# Patient Record
Sex: Male | Born: 1970 | Race: White | Hispanic: No | Marital: Married | Smoking: Former smoker
Health system: Southern US, Community
[De-identification: ages and names within clinical notes are randomized; demographics above are authoritative.]

## PROBLEM LIST (undated history)

## (undated) DIAGNOSIS — M109 Gout, unspecified: Secondary | ICD-10-CM

## (undated) HISTORY — PX: TONSILLECTOMY: SUR1361

---

## 2014-02-19 ENCOUNTER — Ambulatory Visit: Payer: Self-pay | Admitting: Physician Assistant

## 2015-06-28 IMAGING — CR DG FOOT COMPLETE 3+V*L*
3 series · 3 of 3 positions shown · non-contrast
Comparison: None.

CLINICAL DATA: Midfoot pain following injury 2 weeks previous

EXAM:
LEFT FOOT - COMPLETE 3+ VIEW

[foot ap]
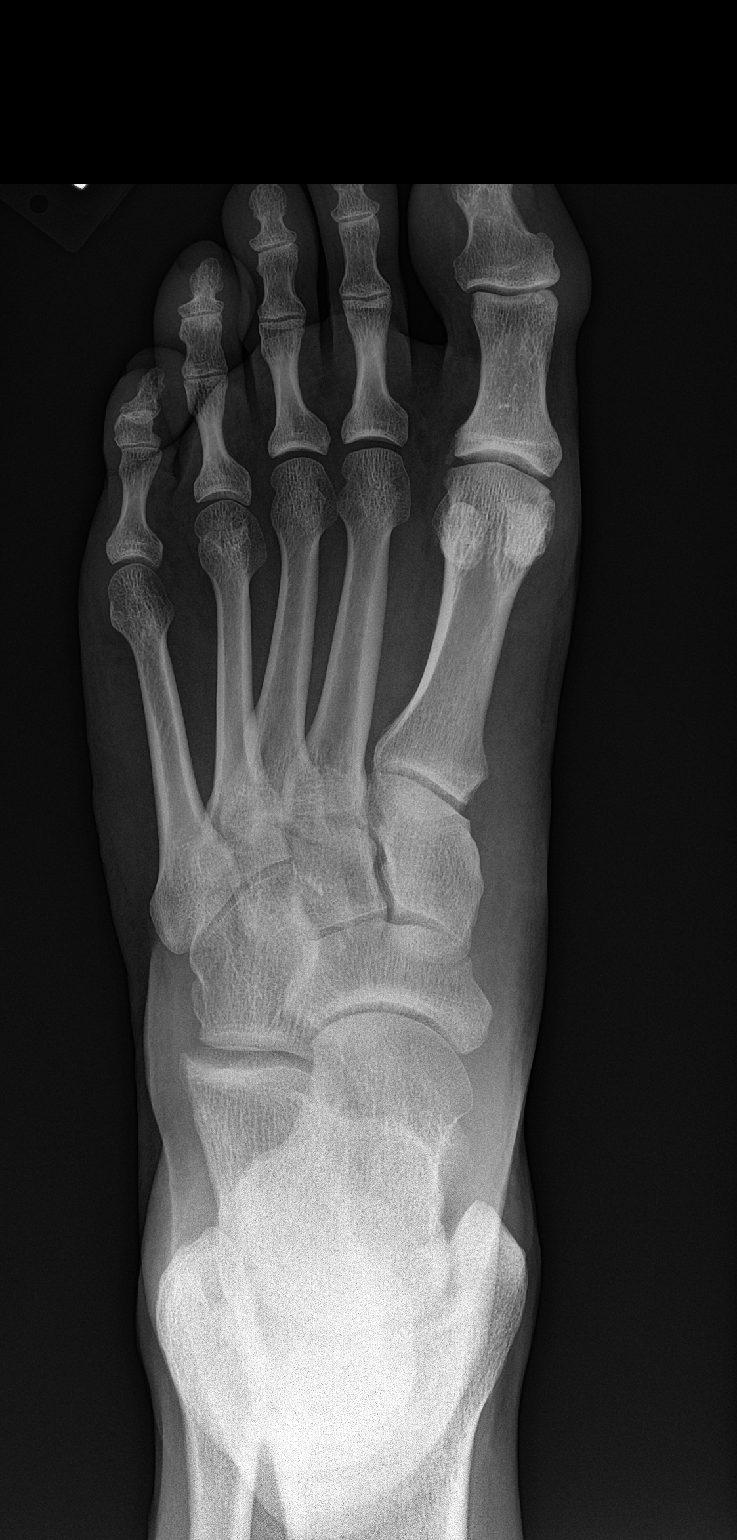

[foot obl]
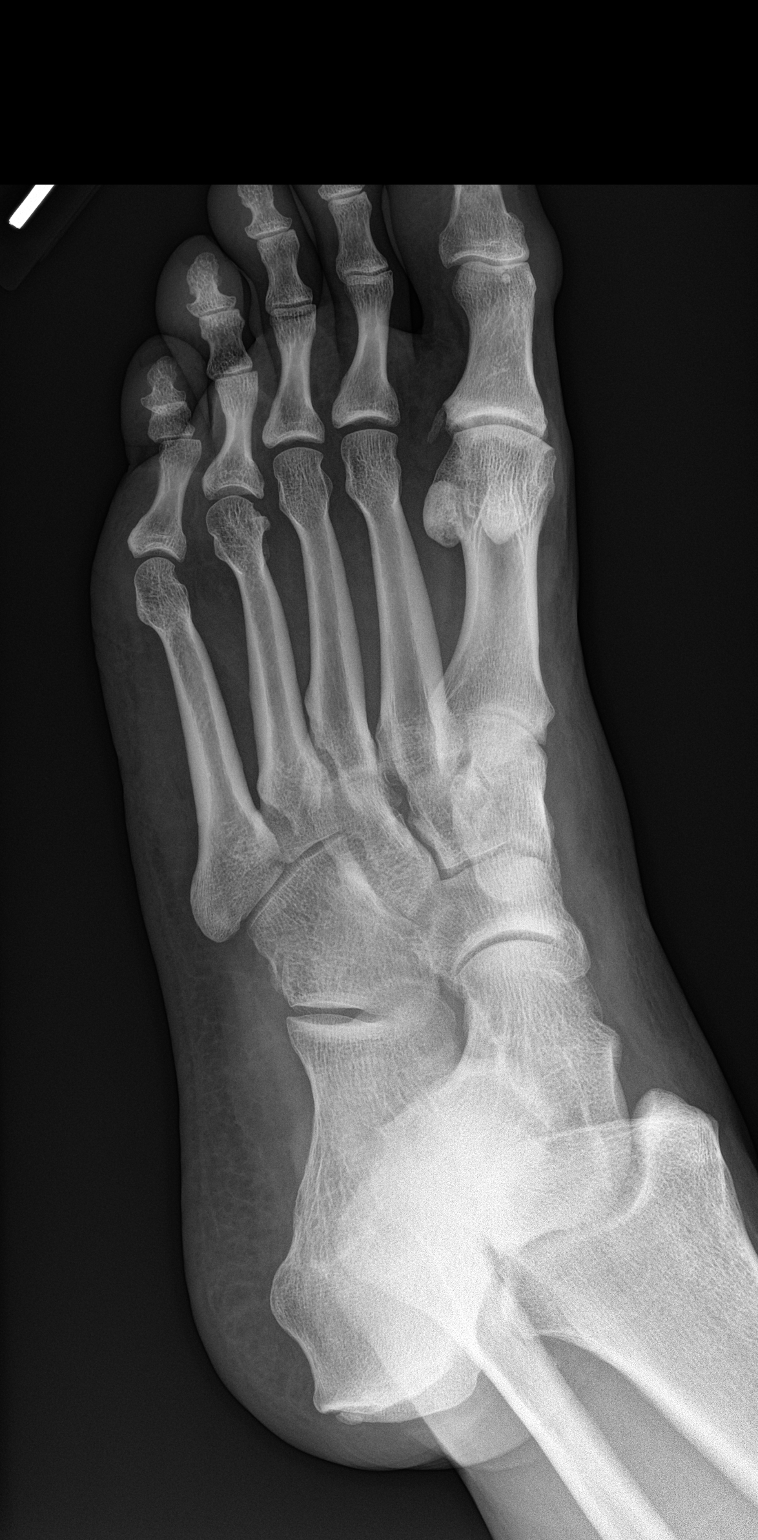

[foot lat]
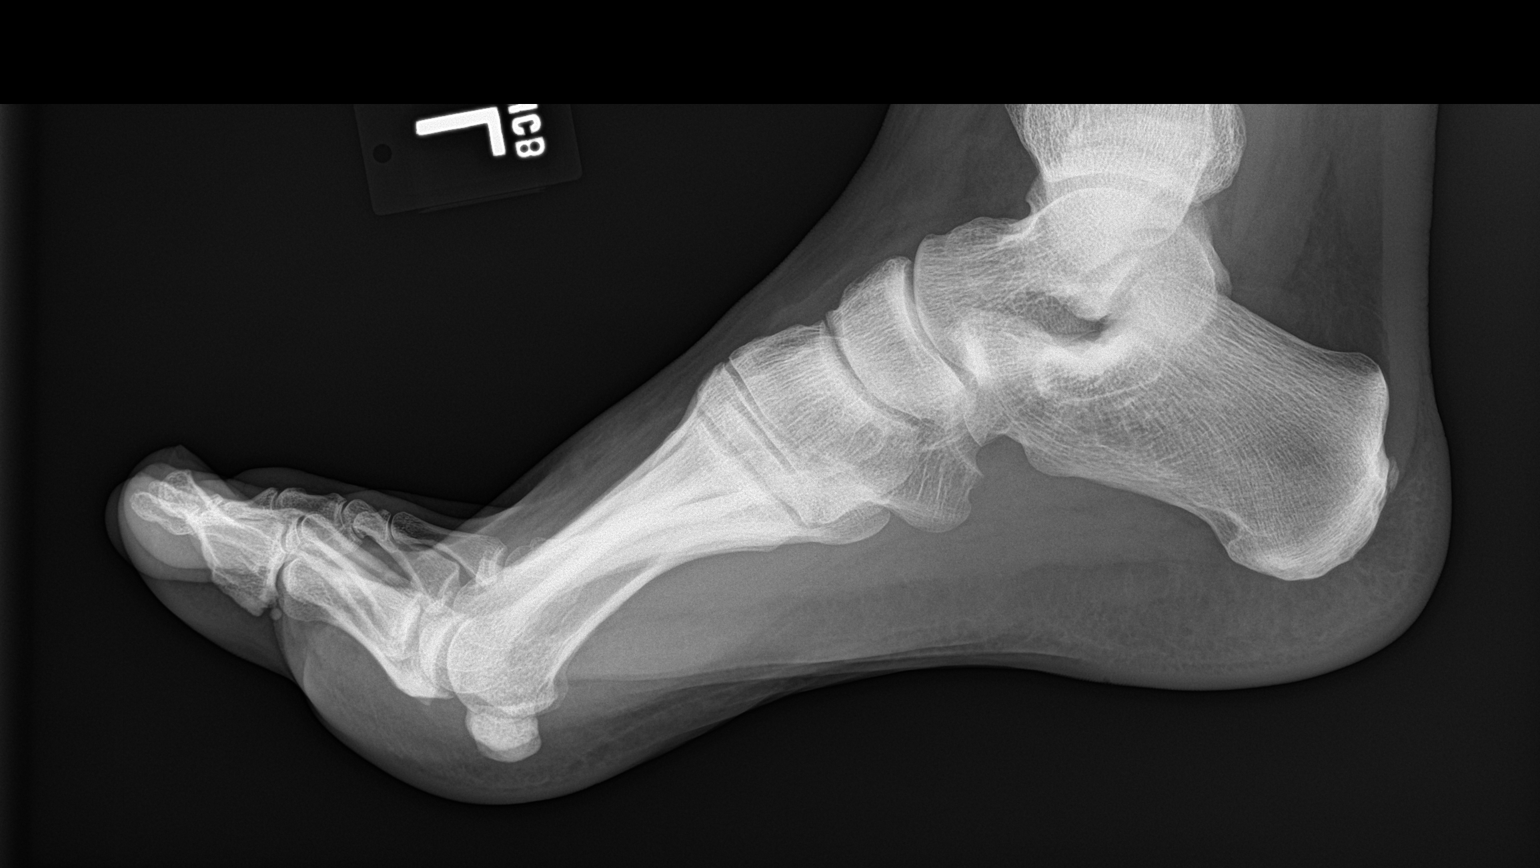

[3 of 3 positions shown; findings below may reference images not displayed]

FINDINGS: There is no evidence of fracture or dislocation. There is no
evidence of arthropathy or other focal bone abnormality. Soft
tissues are unremarkable.
IMPRESSION: No acute abnormality noted.

## 2016-04-16 ENCOUNTER — Telehealth: Payer: Self-pay | Admitting: Orthopedic Surgery

## 2016-04-16 NOTE — Telephone Encounter (Signed)
Patient called to inquire about immediate appointment for knee pain and swelling, states no injury, but seemed to come on all at once. Relayed our provider in clinic today is on call for Emergency room as well as covering for both our doctors, and schedule is over-booked; therefore, relayed primary care doctor, Emergency room, or urgent care.  Patient also is unaware of insurance, which may require primary care referral - patient will call back when and if needs to schedule.

## 2018-04-29 DIAGNOSIS — S29012A Strain of muscle and tendon of back wall of thorax, initial encounter: Secondary | ICD-10-CM | POA: Diagnosis not present

## 2018-05-05 DIAGNOSIS — S29012A Strain of muscle and tendon of back wall of thorax, initial encounter: Secondary | ICD-10-CM | POA: Diagnosis not present

## 2018-12-22 ENCOUNTER — Other Ambulatory Visit: Payer: Self-pay

## 2018-12-22 DIAGNOSIS — Z20822 Contact with and (suspected) exposure to covid-19: Secondary | ICD-10-CM

## 2018-12-22 DIAGNOSIS — R6889 Other general symptoms and signs: Secondary | ICD-10-CM | POA: Diagnosis not present

## 2018-12-25 LAB — NOVEL CORONAVIRUS, NAA: SARS-CoV-2, NAA: NOT DETECTED

## 2019-04-24 ENCOUNTER — Other Ambulatory Visit: Payer: Self-pay

## 2019-04-24 DIAGNOSIS — Z20822 Contact with and (suspected) exposure to covid-19: Secondary | ICD-10-CM

## 2019-04-26 LAB — NOVEL CORONAVIRUS, NAA: SARS-CoV-2, NAA: DETECTED — AB

## 2019-05-05 ENCOUNTER — Other Ambulatory Visit: Payer: Self-pay | Admitting: *Deleted

## 2019-05-05 DIAGNOSIS — Z20822 Contact with and (suspected) exposure to covid-19: Secondary | ICD-10-CM

## 2019-05-07 LAB — NOVEL CORONAVIRUS, NAA: SARS-CoV-2, NAA: DETECTED — AB

## 2019-06-15 DIAGNOSIS — Z23 Encounter for immunization: Secondary | ICD-10-CM | POA: Diagnosis not present

## 2019-07-14 DIAGNOSIS — Z23 Encounter for immunization: Secondary | ICD-10-CM | POA: Diagnosis not present

## 2019-08-28 DIAGNOSIS — S233XXA Sprain of ligaments of thoracic spine, initial encounter: Secondary | ICD-10-CM | POA: Diagnosis not present

## 2019-08-28 DIAGNOSIS — S335XXA Sprain of ligaments of lumbar spine, initial encounter: Secondary | ICD-10-CM | POA: Diagnosis not present

## 2019-08-28 DIAGNOSIS — S134XXA Sprain of ligaments of cervical spine, initial encounter: Secondary | ICD-10-CM | POA: Diagnosis not present

## 2019-10-16 DIAGNOSIS — M25421 Effusion, right elbow: Secondary | ICD-10-CM | POA: Diagnosis not present

## 2019-10-16 DIAGNOSIS — M25521 Pain in right elbow: Secondary | ICD-10-CM | POA: Diagnosis not present

## 2019-10-29 DIAGNOSIS — Z681 Body mass index (BMI) 19 or less, adult: Secondary | ICD-10-CM | POA: Diagnosis not present

## 2019-10-29 DIAGNOSIS — Z1389 Encounter for screening for other disorder: Secondary | ICD-10-CM | POA: Diagnosis not present

## 2019-10-29 DIAGNOSIS — Z0001 Encounter for general adult medical examination with abnormal findings: Secondary | ICD-10-CM | POA: Diagnosis not present

## 2019-10-29 DIAGNOSIS — M1A9XX0 Chronic gout, unspecified, without tophus (tophi): Secondary | ICD-10-CM | POA: Diagnosis not present

## 2020-01-25 DIAGNOSIS — E7849 Other hyperlipidemia: Secondary | ICD-10-CM | POA: Diagnosis not present

## 2020-01-25 DIAGNOSIS — Z0000 Encounter for general adult medical examination without abnormal findings: Secondary | ICD-10-CM | POA: Diagnosis not present

## 2020-02-18 DIAGNOSIS — M1A00X0 Idiopathic chronic gout, unspecified site, without tophus (tophi): Secondary | ICD-10-CM | POA: Diagnosis not present

## 2020-02-18 DIAGNOSIS — Z681 Body mass index (BMI) 19 or less, adult: Secondary | ICD-10-CM | POA: Diagnosis not present

## 2020-06-08 DIAGNOSIS — J0100 Acute maxillary sinusitis, unspecified: Secondary | ICD-10-CM | POA: Diagnosis not present

## 2020-06-08 DIAGNOSIS — Z20828 Contact with and (suspected) exposure to other viral communicable diseases: Secondary | ICD-10-CM | POA: Diagnosis not present

## 2020-06-10 ENCOUNTER — Other Ambulatory Visit: Payer: Self-pay

## 2020-06-10 DIAGNOSIS — Z20822 Contact with and (suspected) exposure to covid-19: Secondary | ICD-10-CM

## 2020-06-14 LAB — NOVEL CORONAVIRUS, NAA: SARS-CoV-2, NAA: NOT DETECTED

## 2021-08-07 DIAGNOSIS — S233XXA Sprain of ligaments of thoracic spine, initial encounter: Secondary | ICD-10-CM | POA: Diagnosis not present

## 2021-08-07 DIAGNOSIS — S338XXA Sprain of other parts of lumbar spine and pelvis, initial encounter: Secondary | ICD-10-CM | POA: Diagnosis not present

## 2021-08-07 DIAGNOSIS — S134XXA Sprain of ligaments of cervical spine, initial encounter: Secondary | ICD-10-CM | POA: Diagnosis not present

## 2022-01-24 ENCOUNTER — Other Ambulatory Visit: Payer: Self-pay

## 2022-01-24 ENCOUNTER — Encounter: Payer: Self-pay | Admitting: Emergency Medicine

## 2022-01-24 ENCOUNTER — Ambulatory Visit
Admission: EM | Admit: 2022-01-24 | Discharge: 2022-01-24 | Disposition: A | Discharge status: Home / Self Care | Payer: BC Managed Care – PPO | Attending: Nurse Practitioner | Admitting: Nurse Practitioner

## 2022-01-24 DIAGNOSIS — M5416 Radiculopathy, lumbar region: Secondary | ICD-10-CM

## 2022-01-24 DIAGNOSIS — G8929 Other chronic pain: Secondary | ICD-10-CM

## 2022-01-24 DIAGNOSIS — Z113 Encounter for screening for infections with a predominantly sexual mode of transmission: Secondary | ICD-10-CM | POA: Insufficient documentation

## 2022-01-24 DIAGNOSIS — R1032 Left lower quadrant pain: Secondary | ICD-10-CM | POA: Diagnosis not present

## 2022-01-24 DIAGNOSIS — N50812 Left testicular pain: Secondary | ICD-10-CM | POA: Diagnosis not present

## 2022-01-24 HISTORY — DX: Gout, unspecified: M10.9

## 2022-01-24 LAB — POCT URINALYSIS DIP (MANUAL ENTRY)
Bilirubin, UA: NEGATIVE
Glucose, UA: NEGATIVE mg/dL
Ketones, POC UA: NEGATIVE mg/dL
Leukocytes, UA: NEGATIVE
Nitrite, UA: NEGATIVE
Protein Ur, POC: NEGATIVE mg/dL
Spec Grav, UA: 1.025 (ref 1.010–1.025)
Urobilinogen, UA: 0.2 E.U./dL
pH, UA: 5 (ref 5.0–8.0)

## 2022-01-24 MED ORDER — KETOROLAC TROMETHAMINE 30 MG/ML IJ SOLN
30.0000 mg | Freq: Once | INTRAMUSCULAR | Status: AC
  Administered 2022-01-24: 30 mg via INTRAMUSCULAR

## 2022-01-24 MED ORDER — DEXAMETHASONE SODIUM PHOSPHATE 10 MG/ML IJ SOLN
10.0000 mg | INTRAMUSCULAR | Status: AC
  Administered 2022-01-24: 10 mg via INTRAMUSCULAR

## 2022-01-24 MED ORDER — IBUPROFEN 800 MG PO TABS: 800.0000 mg | ORAL_TABLET | Freq: Three times a day (TID) | ORAL | 0 refills | Status: DC | PRN

## 2022-01-24 MED ORDER — TAMSULOSIN HCL 0.4 MG PO CAPS: 0.4000 mg | ORAL_CAPSULE | Freq: Every day | ORAL | 0 refills | Status: DC

## 2022-01-24 NOTE — Discharge Instructions (Addendum)
Your urinalysis is negative for urinary tract infection.  A urine culture is pending.  Cytology results are also pending.  You will be contacted if the results are positive to discuss treatment. Increase fluids.  Try to drink at least 10-12 8 ounce glasses of water daily while symptoms persist. Take medication as prescribed.  Take the medication to help urine flow.  If you do have a kidney stone, Strain your urine each time with urination. If your symptoms continue to persist, recommend following up with aurologist for the continued scrotal/testicular pain. Go to the emergency department immediately if you develop worsening scrotal/testicular pain, nausea, vomiting, worsening back pain, or other concerns.

## 2022-01-24 NOTE — ED Triage Notes (Signed)
Pt reports on Monday lower left back pain that is now in left groin and left testicle. Pt denies any known injury and states back pain is resolved but reports constant left groin/leg pain.

## 2022-01-24 NOTE — ED Provider Notes (Signed)
[facility]    CSN: [id number] Arrival date & time: 01/24/22  1711      History   Chief Complaint Chief Complaint  Patient presents with   Testicle Pain    HPI Danny Foster is a 51 y.o. male.   The history is provided by the patient.    Patient presents for complaints of left-sided low back pain and left spicular pain.  Patient states that he has a history of chronic low back pain, but it comes and goes.  He states this episode of his pain started 2 days ago in the left lower back.  Pain is located in the buttock region.  He also states that the pain now radiates into the left groin and left testicle.  Patient states today he has since developed pain that radiates down the front of the thigh.  He denies fever, chills, abdominal pain, nausea, vomiting, urinary frequency, urgency, hesitancy, hematuria, dysuria, penile discharge, lower extremity numbness/tingling, or change in bowel or bladder function.  Patient states that he took Advil for his symptoms which helped, states today the pain has been constant.  He states pain does not change with movement, but changes with sitting.  Patient is married and is in a monogamous relationship with his wife.  Past Medical History:  Diagnosis Date   Gout     There are no problems to display for this patient.   History reviewed. No pertinent surgical history.     Home Medications    Prior to Admission medications   Medication Sig Start Date End Date Taking? Authorizing Provider  ibuprofen (ADVIL) 800 MG tablet Take 1 tablet (800 mg total) by mouth every 8 (eight) hours as needed. 01/24/22  Yes Alazia Crocket-Warren, Sadie Haber, NP  tamsulosin (FLOMAX) 0.4 MG CAPS capsule Take 1 capsule (0.4 mg total) by mouth daily after supper. 01/24/22  Yes Arkin Imran-Warren, Sadie Haber, NP    Family History History reviewed. No pertinent family history.  Social History Social History   Tobacco Use   Smoking status: Never   Smokeless tobacco:  Never     Allergies   Patient has no allergy information on record.   Review of Systems Review of Systems Per HPI  Physical Exam Triage Vital Signs ED Triage Vitals [01/24/22 1817]  Enc Vitals Group     BP 124/84     Pulse Rate 80     Resp 20     Temp 98.2 F (36.8 C)     Temp Source Oral     SpO2 96 %     Weight      Height      Head Circumference      Peak Flow      Pain Score 2     Pain Loc      Pain Edu?      Excl. in GC?    No data found.  Updated Vital Signs BP 124/84 (BP Location: Right Arm)   Pulse 80   Temp 98.2 F (36.8 C) (Oral)   Resp 20   SpO2 96%   Visual Acuity Right Eye Distance:   Left Eye Distance:   Bilateral Distance:    Right Eye Near:   Left Eye Near:    Bilateral Near:     Physical Exam Vitals and nursing note reviewed. Exam conducted with a chaperone present Ardine Eng).  Constitutional:      General: He is not in acute distress.    Appearance: Normal appearance.  He is well-developed.  HENT:     Head: Normocephalic and atraumatic.     Mouth/Throat:     Mouth: Mucous membranes are moist.  Eyes:     Conjunctiva/sclera: Conjunctivae normal.  Cardiovascular:     Rate and Rhythm: Normal rate and regular rhythm.     Pulses: Normal pulses.     Heart sounds: Normal heart sounds. No murmur heard. Pulmonary:     Effort: Pulmonary effort is normal. No respiratory distress.     Breath sounds: Normal breath sounds.  Abdominal:     General: Abdomen is protuberant. Bowel sounds are normal. There is no distension.     Palpations: Abdomen is soft.     Tenderness: There is no abdominal tenderness. There is no right CVA tenderness, left CVA tenderness, guarding or rebound.     Hernia: No hernia is present. There is no hernia in the left inguinal area or right inguinal area.  Genitourinary:    Penis: Uncircumcised. No tenderness or swelling.      Testes: Normal.        Right: Tenderness or swelling not present.        Left:  Tenderness or swelling not present.     Epididymis:     Right: Normal.     Left: Normal.  Musculoskeletal:        General: No swelling.     Cervical back: Normal range of motion.  Lymphadenopathy:     Cervical: No cervical adenopathy.  Skin:    General: Skin is warm and dry.     Capillary Refill: Capillary refill takes less than 2 seconds.  Neurological:     General: No focal deficit present.     Mental Status: He is alert and oriented to person, place, and time.  Psychiatric:        Mood and Affect: Mood normal.        Behavior: Behavior normal.      UC Treatments / Results  Labs (all labs ordered are listed, but only abnormal results are displayed) Labs Reviewed  POCT URINALYSIS DIP (MANUAL ENTRY) - Abnormal; Notable for the following components:      Result Value   Blood, UA trace-lysed (*)    All other components within normal limits  URINE CULTURE  CYTOLOGY, (ORAL, ANAL, URETHRAL) ANCILLARY ONLY    EKG   Radiology No results found.  Procedures Procedures (including critical care time)  Medications Ordered in UC Medications  ketorolac (TORADOL) 30 MG/ML injection 30 mg (30 mg Intramuscular Given 01/24/22 1857)  dexamethasone (DECADRON) injection 10 mg (10 mg Intramuscular Given 01/24/22 1857)    Initial Impression / Assessment and Plan / UC Course  I have reviewed the triage vital signs and the nursing notes.  Pertinent labs & imaging results that were available during my care of the patient were reviewed by me and considered in my medical decision making (see chart for details).  Patient presents for complaints of left testicle, left lower back, and left groin pain that been present for the past 2 to 3 days.  Pain has been intermittent but has worsened today.  Patient's vital signs are stable, he is in no acute distress.  Exam does not reveal any CVA tenderness, scrotal l/testicular tenderness, or hernia.  Patient has decided low back pain that radiates to the  front of the left thigh.  Differential diagnoses include kidney stone, left-sided sciatica, or epididymitis.  Urine culture was positive for blood, urine culture and cytology are  pending.  Patient was provided injections drawn 10 mg IM and Toradol 30 mg IM for his symptoms.  Supportive care recommendations were provided to the patient.  Patient was given a urine strainer, tamsulosin, and ibuprofen for his symptoms.  Patient was advised to follow-up with urology if his symptoms fail to improve.  Patient was given strict indications of when to go to the emergency department.  Patient verbalizes understanding.  All questions were answered. Final Clinical Impressions(s) / UC Diagnoses   Final diagnoses:  Testicular pain, left  Chronic radicular low back pain  Left groin pain     Discharge Instructions      Your urinalysis is negative for urinary tract infection.  A urine culture is pending.  Cytology results are also pending.  You will be contacted if the results are positive to discuss treatment. Increase fluids.  Try to drink at least 10-12 8 ounce glasses of water daily while symptoms persist. Take medication as prescribed.  Take the medication to help urine flow.  If you do have a kidney stone, Strain your urine each time with urination. If your symptoms continue to persist, recommend following up with aurologist for the continued scrotal/testicular pain. Go to the emergency department immediately if you develop worsening scrotal/testicular pain, nausea, vomiting, worsening back pain, or other concerns.      ED Prescriptions     Medication Sig Dispense Auth. Provider   tamsulosin (FLOMAX) 0.4 MG CAPS capsule Take 1 capsule (0.4 mg total) by mouth daily after supper. 30 capsule Daijon Wenke-Warren, Sadie Haber, NP   ibuprofen (ADVIL) 800 MG tablet Take 1 tablet (800 mg total) by mouth every 8 (eight) hours as needed. 30 tablet Samina Weekes-Warren, Sadie Haber, NP      PDMP not reviewed this  encounter.   Abran Cantor, NP 01/24/22 1924

## 2022-01-25 LAB — CYTOLOGY, (ORAL, ANAL, URETHRAL) ANCILLARY ONLY
Chlamydia: NEGATIVE
Comment: NEGATIVE
Comment: NEGATIVE
Comment: NORMAL
Neisseria Gonorrhea: NEGATIVE
Trichomonas: NEGATIVE

## 2022-01-25 LAB — URINE CULTURE: Culture: NO GROWTH

## 2022-01-26 ENCOUNTER — Emergency Department (HOSPITAL_COMMUNITY)
Admission: EM | Admit: 2022-01-26 | Discharge: 2022-01-26 | Disposition: A | Discharge status: Home / Self Care | Payer: BC Managed Care – PPO | Attending: Emergency Medicine | Admitting: Emergency Medicine

## 2022-01-26 ENCOUNTER — Emergency Department (HOSPITAL_COMMUNITY): Payer: BC Managed Care – PPO

## 2022-01-26 ENCOUNTER — Other Ambulatory Visit: Payer: Self-pay

## 2022-01-26 DIAGNOSIS — I861 Scrotal varices: Secondary | ICD-10-CM | POA: Diagnosis not present

## 2022-01-26 DIAGNOSIS — S335XXA Sprain of ligaments of lumbar spine, initial encounter: Secondary | ICD-10-CM | POA: Diagnosis not present

## 2022-01-26 DIAGNOSIS — N433 Hydrocele, unspecified: Secondary | ICD-10-CM | POA: Diagnosis not present

## 2022-01-26 DIAGNOSIS — M545 Low back pain, unspecified: Secondary | ICD-10-CM | POA: Diagnosis not present

## 2022-01-26 DIAGNOSIS — M5442 Lumbago with sciatica, left side: Secondary | ICD-10-CM | POA: Diagnosis not present

## 2022-01-26 DIAGNOSIS — N503 Cyst of epididymis: Secondary | ICD-10-CM | POA: Diagnosis not present

## 2022-01-26 DIAGNOSIS — N200 Calculus of kidney: Secondary | ICD-10-CM | POA: Diagnosis not present

## 2022-01-26 LAB — COMPREHENSIVE METABOLIC PANEL
ALT: 24 U/L (ref 0–44)
AST: 18 U/L (ref 15–41)
Albumin: 4.1 g/dL (ref 3.5–5.0)
Alkaline Phosphatase: 47 U/L (ref 38–126)
Anion gap: 8 (ref 5–15)
BUN: 27 mg/dL — ABNORMAL HIGH (ref 6–20)
CO2: 26 mmol/L (ref 22–32)
Calcium: 8.6 mg/dL — ABNORMAL LOW (ref 8.9–10.3)
Chloride: 107 mmol/L (ref 98–111)
Creatinine, Ser: 1.28 mg/dL — ABNORMAL HIGH (ref 0.61–1.24)
GFR, Estimated: >60 mL/min (ref >60)
Glucose, Bld: 92 mg/dL (ref 70–99)
Potassium: 3.8 mmol/L (ref 3.5–5.1)
Sodium: 141 mmol/L (ref 135–145)
Total Bilirubin: 0.5 mg/dL (ref 0.3–1.2)
Total Protein: 6.9 g/dL (ref 6.5–8.1)

## 2022-01-26 LAB — URINALYSIS, ROUTINE W REFLEX MICROSCOPIC
Bacteria, UA: NONE SEEN
Bilirubin Urine: NEGATIVE
Glucose, UA: NEGATIVE mg/dL
Ketones, ur: NEGATIVE mg/dL
Leukocytes,Ua: NEGATIVE
Nitrite: NEGATIVE
Protein, ur: NEGATIVE mg/dL
Specific Gravity, Urine: 1.027 (ref 1.005–1.030)
pH: 5 (ref 5.0–8.0)

## 2022-01-26 LAB — CBC WITH DIFFERENTIAL/PLATELET
Abs Immature Granulocytes: 0.06 10*3/uL (ref 0.00–0.07)
Basophils Absolute: 0.1 10*3/uL (ref 0.0–0.1)
Basophils Relative: 1 %
Eosinophils Absolute: 0.1 10*3/uL (ref 0.0–0.5)
Eosinophils Relative: 1 %
HCT: 46.3 % (ref 39.0–52.0)
Hemoglobin: 15.3 g/dL (ref 13.0–17.0)
Immature Granulocytes: 0 %
Lymphocytes Relative: 31 %
Lymphs Abs: 4.1 10*3/uL — ABNORMAL HIGH (ref 0.7–4.0)
MCH: 27.6 pg (ref 26.0–34.0)
MCHC: 33 g/dL (ref 30.0–36.0)
MCV: 83.6 fL (ref 80.0–100.0)
Monocytes Absolute: 1 10*3/uL (ref 0.1–1.0)
Monocytes Relative: 7 %
Neutro Abs: 8.1 10*3/uL — ABNORMAL HIGH (ref 1.7–7.7)
Neutrophils Relative %: 60 %
Platelets: 240 10*3/uL (ref 150–400)
RBC: 5.54 MIL/uL (ref 4.22–5.81)
RDW: 14.2 % (ref 11.5–15.5)
WBC: 13.4 10*3/uL — ABNORMAL HIGH (ref 4.0–10.5)
nRBC: 0 % (ref 0.0–0.2)

## 2022-01-26 LAB — LIPASE, BLOOD: Lipase: 34 U/L (ref 11–51)

## 2022-01-26 MED ORDER — IBUPROFEN 600 MG PO TABS: 600.0000 mg | ORAL_TABLET | Freq: Four times a day (QID) | ORAL | 0 refills | Status: DC | PRN

## 2022-01-26 NOTE — ED Provider Notes (Signed)
[facility] EMERGENCY DEPARTMENT Provider Note   CSN: [id number] Arrival date & time: 01/26/22  1341     History Chief Complaint  Patient presents with   Back Pain    Danny Foster is a 51 y.o. male otherwise healthy presents the emergency room and for evaluation of lower left-sided back pain for the past week.  He reports that 5 days ago on Monday he started to have some left-sided testicular pain when he was falling asleep.  He reports he woke up the next morning was feeling fine however that night was having some pain.  He reports that on Wednesday the pain was still present so he went to urgent care.  Urgent care said it was likely a kidney stone and placed him on medications.  He is coming back today because he is still having continued pain.  He reports the pain is worse in his testicles whenever he is sitting on the toilet and is testicles are hanging down.  He denies any dysuria, hematuria, abdominal pain, nausea, vomiting, constipation, diarrhea, fever, urinary fecal incontinence, urinary retention, weakness.  Denies any penile discharge or concern for STDs.  No previous surgeries on his back.  No known drug allergies.  Denies any tobacco, EtOH, illicit drug use ever.  Denies any IV drug use ever.   Back Pain Associated symptoms: no chest pain, no dysuria, no fever, no numbness and no weakness        Home Medications Prior to Admission medications   Medication Sig Start Date End Date Taking? Authorizing Provider  ibuprofen (ADVIL) 800 MG tablet Take 1 tablet (800 mg total) by mouth every 8 (eight) hours as needed. 01/24/22   Leath-Warren, Sadie Haber, NP  tamsulosin (FLOMAX) 0.4 MG CAPS capsule Take 1 capsule (0.4 mg total) by mouth daily after supper. 01/24/22   Leath-Warren, Sadie Haber, NP      Allergies    Patient has no known allergies.    Review of Systems   Review of Systems  Constitutional:  Negative for chills and fever.  Respiratory:  Negative for shortness of  breath.   Cardiovascular:  Negative for chest pain.  Gastrointestinal:  Negative for abdominal distention, constipation, diarrhea, nausea and vomiting.       Denies any fecal incontinence  Genitourinary:  Positive for testicular pain. Negative for decreased urine volume, dysuria, hematuria, penile discharge, penile pain, penile swelling and scrotal swelling.       Denies any urinary incontinence or urinary retention.  Musculoskeletal:  Positive for back pain.       Denies any saddle anesthesia  Neurological:  Negative for weakness and numbness.    Physical Exam Updated Vital Signs BP 122/78 (BP Location: Right Arm)   Pulse 66   Temp 97.6 F (36.4 C) (Oral)   Resp 16   Ht 6\' 3"  (1.905 m)   Wt 131.5 kg   SpO2 100%   BMI 36.25 kg/m  Physical Exam Vitals and nursing note reviewed. Exam conducted with a chaperone present ( , [place]).  Constitutional:      General: He is not in acute distress.    Appearance: Normal appearance. He is not ill-appearing or toxic-appearing.  HENT:     Head: Normocephalic and atraumatic.  Eyes:     General: No scleral icterus. Cardiovascular:     Rate and Rhythm: Normal rate and regular rhythm.  Pulmonary:     Effort: Pulmonary effort is normal. No respiratory distress.     Breath sounds: Normal  breath sounds.  Abdominal:     General: Abdomen is flat. Bowel sounds are normal.     Palpations: Abdomen is soft.     Tenderness: There is no abdominal tenderness. There is no right CVA tenderness, left CVA tenderness, guarding or rebound.  Genitourinary:    Pubic Area: No rash.      Penis: Normal. No tenderness, discharge or swelling.      Testes:        Left: Varicocele present. Tenderness not present.     Comments: Normal lie of bilateral testicles.  Suspect varicocele palpated to the posterior upper aspect of the left testicle.  No discoloration noted.  No discharge from the penis noted.  No increased warmth or erythema.  No wounds  noted. Musculoskeletal:        General: No swelling, tenderness or deformity.     Cervical back: Normal range of motion.     Comments: No midline or paraspinal, thoracic, or lumbar tenderness palpation.  No bony step-offs or deformities.  No increased erythema or warmth noted to the spine.  No tenderness to the flank.  Cannot reproduce pain via palpation.  Sensation intact throughout.  Patient has 5 out of 5 strength in patient's upper and lower bilateral extremities.  Compartments are soft.  Palpable pulses.  Patient is ambulatory with ease.  Skin:    General: Skin is warm and dry.  Neurological:     General: No focal deficit present.     Mental Status: He is alert. Mental status is at baseline.     Sensory: No sensory deficit.     Motor: No weakness.     Gait: Gait normal.     ED Results / Procedures / Treatments   Labs (all labs ordered are listed, but only abnormal results are displayed) Labs Reviewed  URINALYSIS, ROUTINE W REFLEX MICROSCOPIC - Abnormal; Notable for the following components:      Result Value   Hgb urine dipstick SMALL (*)    All other components within normal limits  CBC WITH DIFFERENTIAL/PLATELET - Abnormal; Notable for the following components:   WBC 13.4 (*)    Neutro Abs 8.1 (*)    Lymphs Abs 4.1 (*)    All other components within normal limits  COMPREHENSIVE METABOLIC PANEL  LIPASE, BLOOD  GC/CHLAMYDIA PROBE AMP ([place]) NOT AT [facility]    EKG None  Radiology US SCROTUM W/DOPPLER  Result Date: 01/26/2022 CLINICAL DATA:  Left testicular pain EXAM: SCROTAL ULTRASOUND DOPPLER ULTRASOUND OF THE TESTICLES TECHNIQUE: Complete ultrasound examination of the testicles, epididymis, and other scrotal structures was performed. Color and spectral Doppler ultrasound were also utilized to evaluate blood flow to the testicles. COMPARISON:  None Available. FINDINGS: Right testicle Measurements: 5.0 x 2.3 x 3.3 cm. No mass or microlithiasis visualized. Left testicle  Measurements: 4.9 x 2.0 x 2.9 cm. No mass or microlithiasis visualized. Right epididymis: Normal in size and appearance. 3 mm benign epididymal cyst noted. Left epididymis:  Normal in size and appearance. Hydrocele: None visualized. Physiologic fluid within the scrotal sac bilaterally. Varicocele:  Moderate left varicocele noted. Pulsed Doppler interrogation of both testes demonstrates normal low resistance arterial and venous waveforms bilaterally. IMPRESSION: 1. No evidence of testicular torsion. 2. Moderate left varicocele. Electronically Signed   By: Helyn Numbers M.D.   On: 01/26/2022 19:35   CT Renal Stone Study  Result Date: 01/26/2022 CLINICAL DATA:  Left back pain x1 week, diagnosed with kidney stone EXAM: CT ABDOMEN AND PELVIS WITHOUT  CONTRAST TECHNIQUE: Multidetector CT imaging of the abdomen and pelvis was performed following the standard protocol without IV contrast. RADIATION DOSE REDUCTION: This exam was performed according to the departmental dose-optimization program which includes automated exposure control, adjustment of the mA and/or kV according to patient size and/or use of iterative reconstruction technique. COMPARISON:  None Available. FINDINGS: Lower chest: Lung bases are clear. Hepatobiliary: Unenhanced liver is unremarkable. Gallbladder is unremarkable. No intrahepatic or extrahepatic duct dilatation. Pancreas: Within normal limits. Spleen: Within normal limits. Adrenals/Urinary Tract: Adrenal glands are within normal limits. Kidneys are within normal limits. No renal, ureteral, or bladder calculi. No hydronephrosis. Bladder is within normal limits. Stomach/Bowel: Stomach is within normal limits. No evidence of bowel obstruction. Normal appendix (series 2/image 49). No colonic wall thickening or inflammatory changes. Vascular/Lymphatic: No evidence of abdominal aortic aneurysm. Atherosclerotic calcifications of the abdominal aorta and branch vessels. No suspicious abdominopelvic  lymphadenopathy. Reproductive: Prostate is unremarkable. Other: No abdominopelvic ascites. Musculoskeletal: Mild degenerative changes of the lower thoracic spine. IMPRESSION: No renal, ureteral, or bladder calculi. No hydronephrosis. Negative CT abdomen/pelvis. Electronically Signed   By: Charline Bills M.D.   On: 01/26/2022 18:01     Procedures Procedures   Medications Ordered in ED Medications - No data to display  ED Course/ Medical Decision Making/ A&P                           Medical Decision Making Amount and/or Complexity of Data Reviewed Labs: ordered. Radiology: ordered.  Risk Prescription drug management.   51 year old male presents the emergency room for evaluation of left lower back pain and left testicle pain.  Differential diagnosis includes was limited to nephrolithiasis, pyelonephritis, sciatica, testicular torsion, varicocele, hydrocele, epididymitis, UTI.  Vital signs are unremarkable.  Physical exam as noted above.  We will order CT renal to rule out any kidney stone as well as basic labs.  I independently reviewed and interpreted the patient's labs.  CMP shows creatinine at 1.28 with a BUN of 27, no previous labs compare this to.  Mild decrease in calcium at 8.6.  Otherwise, no electrolyte or LFT abnormalities.  Lipase at 34.  CBC mildly elevated at 13.4 with a left shift although patient did receive Decadron a few days ago.  Urinalysis shows small amount of hemoglobin otherwise unremarkable.  Lipase normal.  GC pending.  CT renal showed no renal, ureteral, or bladder calculi. No hydronephrosis. Negative CT abdomen/pelvis.   We will proceed with ultrasound.  US shows 1. No evidence of testicular torsion. 2. Moderate left varicocele.  Patient is likely experiencing left testicular pain from his varicocele.  Pain is relieved whenever he is laying down, I doubt any vascular congestion however will have patient follow-up with urologist.  I doubt any epidural  abscess given the patient's reassuring vital signs and denies any history of any IV drug use.  Urinalysis not consistent with any UTI.  Does not suspect any cauda equina given the patient's lack of incontinence or urinary retention or saddle anesthesia.  We will have the patient follow-up with his primary care doctor.  We will have him start on ibuprofen 60 mg every 6 hours as needed for pain.  Additional information included on varicoceles into the discharge paperwork.  Included information for a urologist in the discharge paperwork.  I discussed the patient's labs and imaging results with him.  We discussed the need to follow-up with urology.  I do not think the patient's  back pain is related to his testicular pain, although could be referred pain.  I do not think he has any emergent or abnormal back red flag symptoms.  Can follow-up with his primary care doctor.  We discussed strict return precautions and red flag symptoms.  Patient verbalized understanding and agrees to the plan.  Patient is stable being discharged home in good condition.  I discussed this case with my attending physician who cosigned this note including patient's presenting symptoms, physical exam, and planned diagnostics and interventions. Attending physician stated agreement with plan or made changes to plan which were implemented.   Final Clinical Impression(s) / ED Diagnoses Final diagnoses:  Varicocele  Acute left-sided low back pain without sciatica    Rx / DC Orders ED Discharge Orders          Ordered    ibuprofen (ADVIL) 600 MG tablet  Every 6 hours PRN        01/26/22 2009              Achille Rich, [place] 01/30/22 1415    Vanetta Mulders, MD 02/02/22 [phone]

## 2022-01-26 NOTE — ED Triage Notes (Signed)
Pt to ED via POV c/o back pain and left leg pain, Pt seen for the same at urgent care on Wednesday and dx with kidney stone. Currently taking flomax. Here today because back still hurts.

## 2022-01-26 NOTE — Discharge Instructions (Addendum)
You were seen in the ER for evaluation of your lower back and testicle pain. Unsure of what is causing your back pain, your physical exam is normal for your back. The CT did show some mild degenerative changes, which could be the source. I would like for you to follow up with your PCP about this. Your [place] did show a varicocele on your left testicle which may be the source of your pain. I have included more information about them in this paperwork. I would like for you to follow up with urology for this. I have left the information for a urologist into the discharge paperwork for you to follow up with.  If you have any urinary or fecal incontinence, urinary retention, numbness in your bottom area, fevers, weakness, worsening pain, please return to the nearest emergency department for evaluation.  If you have any concerns, new or worsening symptoms, please return to the ER for re-evaluation.    Contact a health care provider if: Your pain is increasing. You have redness in the affected area. Your testicle becomes enlarged, swollen, or painful. You have swelling that does not decrease when you are lying down. One of your testicles is smaller than the other. Get help right away if: You develop swelling in your legs. You have difficulty breathing.

## 2022-01-29 DIAGNOSIS — M5442 Lumbago with sciatica, left side: Secondary | ICD-10-CM | POA: Diagnosis not present

## 2022-01-29 DIAGNOSIS — S335XXA Sprain of ligaments of lumbar spine, initial encounter: Secondary | ICD-10-CM | POA: Diagnosis not present

## 2022-01-29 LAB — GC/CHLAMYDIA PROBE AMP (~~LOC~~) NOT AT ARMC
Chlamydia: NEGATIVE
Comment: NEGATIVE
Comment: NORMAL
Neisseria Gonorrhea: NEGATIVE

## 2022-01-30 DIAGNOSIS — M5442 Lumbago with sciatica, left side: Secondary | ICD-10-CM | POA: Diagnosis not present

## 2022-01-30 DIAGNOSIS — E6609 Other obesity due to excess calories: Secondary | ICD-10-CM | POA: Diagnosis not present

## 2022-01-30 DIAGNOSIS — S335XXA Sprain of ligaments of lumbar spine, initial encounter: Secondary | ICD-10-CM | POA: Diagnosis not present

## 2022-01-30 DIAGNOSIS — M5416 Radiculopathy, lumbar region: Secondary | ICD-10-CM | POA: Diagnosis not present

## 2022-01-30 DIAGNOSIS — Z6836 Body mass index (BMI) 36.0-36.9, adult: Secondary | ICD-10-CM | POA: Diagnosis not present

## 2022-01-31 DIAGNOSIS — S335XXA Sprain of ligaments of lumbar spine, initial encounter: Secondary | ICD-10-CM | POA: Diagnosis not present

## 2022-01-31 DIAGNOSIS — M5442 Lumbago with sciatica, left side: Secondary | ICD-10-CM | POA: Diagnosis not present

## 2022-02-01 DIAGNOSIS — M5442 Lumbago with sciatica, left side: Secondary | ICD-10-CM | POA: Diagnosis not present

## 2022-02-01 DIAGNOSIS — S335XXA Sprain of ligaments of lumbar spine, initial encounter: Secondary | ICD-10-CM | POA: Diagnosis not present

## 2022-02-06 ENCOUNTER — Other Ambulatory Visit (HOSPITAL_COMMUNITY): Payer: Self-pay | Admitting: Internal Medicine

## 2022-02-06 DIAGNOSIS — M5442 Lumbago with sciatica, left side: Secondary | ICD-10-CM | POA: Diagnosis not present

## 2022-02-06 DIAGNOSIS — S335XXA Sprain of ligaments of lumbar spine, initial encounter: Secondary | ICD-10-CM | POA: Diagnosis not present

## 2022-02-06 DIAGNOSIS — M5416 Radiculopathy, lumbar region: Secondary | ICD-10-CM

## 2022-02-07 DIAGNOSIS — M5442 Lumbago with sciatica, left side: Secondary | ICD-10-CM | POA: Diagnosis not present

## 2022-02-07 DIAGNOSIS — S335XXA Sprain of ligaments of lumbar spine, initial encounter: Secondary | ICD-10-CM | POA: Diagnosis not present

## 2022-02-08 ENCOUNTER — Other Ambulatory Visit (HOSPITAL_COMMUNITY): Payer: Self-pay | Admitting: Internal Medicine

## 2022-02-08 DIAGNOSIS — M5442 Lumbago with sciatica, left side: Secondary | ICD-10-CM | POA: Diagnosis not present

## 2022-02-08 DIAGNOSIS — Z0189 Encounter for other specified special examinations: Secondary | ICD-10-CM

## 2022-02-08 DIAGNOSIS — S335XXA Sprain of ligaments of lumbar spine, initial encounter: Secondary | ICD-10-CM | POA: Diagnosis not present

## 2022-02-09 DIAGNOSIS — M5442 Lumbago with sciatica, left side: Secondary | ICD-10-CM | POA: Diagnosis not present

## 2022-02-09 DIAGNOSIS — S335XXA Sprain of ligaments of lumbar spine, initial encounter: Secondary | ICD-10-CM | POA: Diagnosis not present

## 2022-02-12 ENCOUNTER — Ambulatory Visit
Admission: RE | Admit: 2022-02-12 | Discharge: 2022-02-12 | Disposition: A | Discharge status: Home / Self Care | Payer: BC Managed Care – PPO | Source: Ambulatory Visit | Attending: Internal Medicine | Admitting: Internal Medicine

## 2022-02-12 DIAGNOSIS — M5126 Other intervertebral disc displacement, lumbar region: Secondary | ICD-10-CM | POA: Diagnosis not present

## 2022-02-12 DIAGNOSIS — Z0189 Encounter for other specified special examinations: Secondary | ICD-10-CM

## 2022-02-12 DIAGNOSIS — M5416 Radiculopathy, lumbar region: Secondary | ICD-10-CM | POA: Diagnosis not present

## 2022-02-12 DIAGNOSIS — Z135 Encounter for screening for eye and ear disorders: Secondary | ICD-10-CM | POA: Diagnosis not present

## 2022-02-13 DIAGNOSIS — S335XXA Sprain of ligaments of lumbar spine, initial encounter: Secondary | ICD-10-CM | POA: Diagnosis not present

## 2022-02-13 DIAGNOSIS — M5442 Lumbago with sciatica, left side: Secondary | ICD-10-CM | POA: Diagnosis not present

## 2022-02-15 DIAGNOSIS — S335XXA Sprain of ligaments of lumbar spine, initial encounter: Secondary | ICD-10-CM | POA: Diagnosis not present

## 2022-02-15 DIAGNOSIS — M5442 Lumbago with sciatica, left side: Secondary | ICD-10-CM | POA: Diagnosis not present

## 2022-02-16 DIAGNOSIS — M5442 Lumbago with sciatica, left side: Secondary | ICD-10-CM | POA: Diagnosis not present

## 2022-02-16 DIAGNOSIS — S335XXA Sprain of ligaments of lumbar spine, initial encounter: Secondary | ICD-10-CM | POA: Diagnosis not present

## 2022-02-19 DIAGNOSIS — S335XXA Sprain of ligaments of lumbar spine, initial encounter: Secondary | ICD-10-CM | POA: Diagnosis not present

## 2022-02-19 DIAGNOSIS — M5442 Lumbago with sciatica, left side: Secondary | ICD-10-CM | POA: Diagnosis not present

## 2022-02-22 DIAGNOSIS — S335XXA Sprain of ligaments of lumbar spine, initial encounter: Secondary | ICD-10-CM | POA: Diagnosis not present

## 2022-02-22 DIAGNOSIS — M5442 Lumbago with sciatica, left side: Secondary | ICD-10-CM | POA: Diagnosis not present

## 2022-02-27 DIAGNOSIS — S335XXA Sprain of ligaments of lumbar spine, initial encounter: Secondary | ICD-10-CM | POA: Diagnosis not present

## 2022-02-27 DIAGNOSIS — M5442 Lumbago with sciatica, left side: Secondary | ICD-10-CM | POA: Diagnosis not present

## 2022-03-06 DIAGNOSIS — S335XXA Sprain of ligaments of lumbar spine, initial encounter: Secondary | ICD-10-CM | POA: Diagnosis not present

## 2022-03-06 DIAGNOSIS — M5126 Other intervertebral disc displacement, lumbar region: Secondary | ICD-10-CM | POA: Diagnosis not present

## 2022-03-06 DIAGNOSIS — M5442 Lumbago with sciatica, left side: Secondary | ICD-10-CM | POA: Diagnosis not present

## 2022-03-06 DIAGNOSIS — M5440 Lumbago with sciatica, unspecified side: Secondary | ICD-10-CM | POA: Diagnosis not present

## 2022-03-09 DIAGNOSIS — Z125 Encounter for screening for malignant neoplasm of prostate: Secondary | ICD-10-CM | POA: Diagnosis not present

## 2022-03-09 DIAGNOSIS — M5416 Radiculopathy, lumbar region: Secondary | ICD-10-CM | POA: Diagnosis not present

## 2022-03-09 DIAGNOSIS — Z1331 Encounter for screening for depression: Secondary | ICD-10-CM | POA: Diagnosis not present

## 2022-03-09 DIAGNOSIS — Z0001 Encounter for general adult medical examination with abnormal findings: Secondary | ICD-10-CM | POA: Diagnosis not present

## 2022-03-09 DIAGNOSIS — Z6836 Body mass index (BMI) 36.0-36.9, adult: Secondary | ICD-10-CM | POA: Diagnosis not present

## 2022-03-13 DIAGNOSIS — S335XXA Sprain of ligaments of lumbar spine, initial encounter: Secondary | ICD-10-CM | POA: Diagnosis not present

## 2022-03-13 DIAGNOSIS — M5442 Lumbago with sciatica, left side: Secondary | ICD-10-CM | POA: Diagnosis not present

## 2022-03-20 ENCOUNTER — Encounter: Payer: Self-pay | Admitting: Urology

## 2022-03-20 ENCOUNTER — Ambulatory Visit: Payer: BC Managed Care – PPO | Admitting: Urology

## 2022-03-20 VITALS — BP 138/82 | HR 112 | Ht 75.0 in | Wt 275.0 lb

## 2022-03-20 DIAGNOSIS — R319 Hematuria, unspecified: Secondary | ICD-10-CM | POA: Diagnosis not present

## 2022-03-20 DIAGNOSIS — I861 Scrotal varices: Secondary | ICD-10-CM | POA: Diagnosis not present

## 2022-03-20 LAB — URINALYSIS, ROUTINE W REFLEX MICROSCOPIC
Bilirubin, UA: NEGATIVE
Glucose, UA: NEGATIVE
Nitrite, UA: NEGATIVE
Protein,UA: NEGATIVE
Specific Gravity, UA: 1.02 (ref 1.005–1.030)
Urobilinogen, Ur: 0.2 mg/dL (ref 0.2–1.0)
pH, UA: 5.5 (ref 5.0–7.5)

## 2022-03-20 LAB — MICROSCOPIC EXAMINATION: Bacteria, UA: NONE SEEN

## 2022-03-20 NOTE — Progress Notes (Signed)
Assessment: 1. Hematuria - dipstick only   2. Varicocele, left     Plan: I reviewed the patient records from his recent urgent care and ER visits as well as notes from Dr. Phillips Odor. I reviewed imaging and lab results as well. Diagnosis of hematuria discussed with the patient.  Advised him that this is based on microscopic evaluation rather than dipstick urinalysis.  He no evidence of microscopic hematuria on urinalysis from 8/25 and today. Recommend repeat urinalysis in 1 month.  If this is negative, no further evaluation would be indicated. Diagnosis of a varicocele discussed.  He is currently asymptomatic.  No treatment recommended.  Chief Complaint:  Chief Complaint  Patient presents with   Hematuria    History of Present Illness:  Danny Foster is a 51 y.o. male who is seen in consultation from Elfredia Nevins, MD for evaluation of microscopic hematuria. He was evaluated for left low back pain with radiation into the left groin and scrotal area at urgent care on 01/24/2022 and in the emergency room on 01/26/2022.  A scrotal ultrasound on 01/26/2022 showed normal testes bilaterally, 3 mm right epididymal cyst, and moderate left varicocele. CT abdomen and pelvis without contrast performed on 01/26/2022 for evaluation of left sided back pain showed no renal or ureteral calculi and no evidence of obstruction. He was ultimately diagnosed with a pinched nerve in his back.  His back pain has resolved.  No further scrotal or testicular pain.  No scrotal swelling or redness. He is not having any lower urinary tract symptoms.  No dysuria or gross hematuria.  No history of UTIs or kidney stones. Urinalysis from 01/26/2022 showed 0-5 RBCs. Dipstick urinalysis from 03/09/2022 showed trace blood.  He has a prior history of tobacco use smoking 1-1/2 packs/day for 18 years.  He quit in 2010.  IPSS = 0 today.  Past Medical History:  Past Medical History:  Diagnosis Date   Gout     Past  Surgical History:  Past Surgical History:  Procedure Laterality Date   TONSILLECTOMY      Allergies:  No Known Allergies  Family History:  History reviewed. No pertinent family history.  Social History:  Social History   Tobacco Use   Smoking status: Former    Packs/day: 1.50    Years: 18.00    Total pack years: 27.00    Types: Cigarettes    Quit date: 2010    Years since quitting: 13.8   Smokeless tobacco: Never  Substance Use Topics   Alcohol use: Not Currently   Drug use: Never    Review of symptoms:  Constitutional:  Negative for unexplained weight loss, night sweats, fever, chills ENT:  Negative for nose bleeds, sinus pain, painful swallowing CV:  Negative for chest pain, shortness of breath, exercise intolerance, palpitations, loss of consciousness Resp:  Negative for cough, wheezing, shortness of breath GI:  Negative for nausea, vomiting, diarrhea, bloody stools GU:  Positives noted in HPI; otherwise negative for gross hematuria, dysuria, urinary incontinence Neuro:  Negative for seizures, poor balance, limb weakness, slurred speech Psych:  Negative for lack of energy, depression, anxiety Endocrine:  Negative for polydipsia, polyuria, symptoms of hypoglycemia (dizziness, hunger, sweating) Hematologic:  Negative for anemia, purpura, petechia, prolonged or excessive bleeding, use of anticoagulants  Allergic:  Negative for difficulty breathing or choking as a result of exposure to anything; no shellfish allergy; no allergic response (rash/itch) to materials, foods  Physical exam: BP 138/82   Pulse (!) 112  Ht 6\' 3"  (1.905 m)   Wt 275 lb (124.7 kg)   BMI 34.37 kg/m  GENERAL APPEARANCE:  Well appearing, well developed, well nourished, NAD HEENT: Atraumatic, Normocephalic, oropharynx clear. NECK: Supple without lymphadenopathy or thyromegaly. LUNGS: Clear to auscultation bilaterally. HEART: Regular Rate and Rhythm without murmurs, gallops, or rubs. ABDOMEN:  Soft, non-tender, No Masses. EXTREMITIES: Moves all extremities well.  Without clubbing, cyanosis, or edema. NEUROLOGIC:  Alert and oriented x 3, normal gait, CN II-XII grossly intact.  MENTAL STATUS:  Appropriate. BACK:  Non-tender to palpation.  No CVAT SKIN:  Warm, dry and intact.   GU: Penis:  uncircumcised Meatus: Normal Scrotum: No erythema or edema; fullness of left spermatic cord; possible varicocele Testis: normal without masses bilateral Epididymis: normal   Results: U/A: 0-5 WBC, 0-2 RBC

## 2022-03-26 DIAGNOSIS — S335XXA Sprain of ligaments of lumbar spine, initial encounter: Secondary | ICD-10-CM | POA: Diagnosis not present

## 2022-03-26 DIAGNOSIS — M5442 Lumbago with sciatica, left side: Secondary | ICD-10-CM | POA: Diagnosis not present

## 2022-04-09 DIAGNOSIS — S335XXA Sprain of ligaments of lumbar spine, initial encounter: Secondary | ICD-10-CM | POA: Diagnosis not present

## 2022-04-09 DIAGNOSIS — M5442 Lumbago with sciatica, left side: Secondary | ICD-10-CM | POA: Diagnosis not present

## 2022-04-17 ENCOUNTER — Ambulatory Visit: Payer: BC Managed Care – PPO | Admitting: Urology

## 2022-04-17 DIAGNOSIS — M5440 Lumbago with sciatica, unspecified side: Secondary | ICD-10-CM | POA: Diagnosis not present

## 2022-04-19 ENCOUNTER — Encounter: Payer: Self-pay | Admitting: Urology

## 2022-04-19 ENCOUNTER — Ambulatory Visit: Payer: BC Managed Care – PPO | Admitting: Urology

## 2022-04-19 VITALS — BP 118/78 | HR 83 | Ht 75.0 in | Wt 278.0 lb

## 2022-04-19 DIAGNOSIS — R319 Hematuria, unspecified: Secondary | ICD-10-CM | POA: Diagnosis not present

## 2022-04-19 NOTE — Progress Notes (Signed)
   Assessment: 1. Hematuria - dipstick only    Plan: Diagnosis of hematuria discussed with the patient.  Advised him that this is based on microscopic evaluation rather than dipstick urinalysis.   No evidence of microscopic hematuria on urinalysis from 8/25, 10/17, and today. Return to office prn  Chief Complaint:  Chief Complaint  Patient presents with   Hematuria    History of Present Illness:  Danny Foster is a 51 y.o. male who is seen for further evaluation of hematuria. He was evaluated for left low back pain with radiation into the left groin and scrotal area at urgent care on 01/24/2022 and in the emergency room on 01/26/2022.  A scrotal ultrasound on 01/26/2022 showed normal testes bilaterally, 3 mm right epididymal cyst, and moderate left varicocele. CT abdomen and pelvis without contrast performed on 01/26/2022 for evaluation of left sided back pain showed no renal or ureteral calculi and no evidence of obstruction. He was ultimately diagnosed with a pinched nerve in his back.  His back pain resolved.  No further scrotal or testicular pain.  No scrotal swelling or redness. No lower urinary tract symptoms.  No dysuria or gross hematuria.  No history of UTIs or kidney stones. Urinalysis from 01/26/2022 showed 0-5 RBCs. Dipstick urinalysis from 03/09/2022 showed trace blood.  He has a prior history of tobacco use smoking 1-1/2 packs/day for 18 years.  He quit in 2010.  IPSS = 0 at visit in 10/23. U/A:  0-2 RBCs  He returns today for scheduled follow-up.  No change in urinary symptoms.  No dysuria or gross hematuria.  No scrotal pain.  Portions of the above documentation were copied from a prior visit for review purposes only.   Past Medical History:  Past Medical History:  Diagnosis Date   Gout     Past Surgical History:  Past Surgical History:  Procedure Laterality Date   TONSILLECTOMY      Allergies:  No Known Allergies  Family History:  No family history on  file.  Social History:  Social History   Tobacco Use   Smoking status: Former    Packs/day: 1.50    Years: 18.00    Total pack years: 27.00    Types: Cigarettes    Quit date: 2010    Years since quitting: 13.8   Smokeless tobacco: Never  Substance Use Topics   Alcohol use: Not Currently   Drug use: Never    ROS: Constitutional:  Negative for fever, chills, weight loss CV: Negative for chest pain, previous MI, hypertension Respiratory:  Negative for shortness of breath, wheezing, sleep apnea, frequent cough GI:  Negative for nausea, vomiting, bloody stool, GERD  Physical exam: BP 118/78   Pulse 83   Ht 6\' 3"  (1.905 m)   Wt 278 lb (126.1 kg)   BMI 34.75 kg/m  GENERAL APPEARANCE:  Well appearing, well developed, well nourished, NAD HEENT:  Atraumatic, normocephalic, oropharynx clear NECK:  Supple without lymphadenopathy or thyromegaly ABDOMEN:  Soft, non-tender, no masses EXTREMITIES:  Moves all extremities well, without clubbing, cyanosis, or edema NEUROLOGIC:  Alert and oriented x 3, normal gait, CN II-XII grossly intact MENTAL STATUS:  appropriate BACK:  Non-tender to palpation, No CVAT SKIN:  Warm, dry, and intact  Results: U/A: dipstick negative

## 2022-04-20 LAB — URINALYSIS, ROUTINE W REFLEX MICROSCOPIC
Bilirubin, UA: NEGATIVE
Glucose, UA: NEGATIVE
Leukocytes,UA: NEGATIVE
Nitrite, UA: NEGATIVE
Protein,UA: NEGATIVE
RBC, UA: NEGATIVE
Specific Gravity, UA: 1.025 (ref 1.005–1.030)
Urobilinogen, Ur: 0.2 mg/dL (ref 0.2–1.0)
pH, UA: 5.5 (ref 5.0–7.5)

## 2022-04-23 DIAGNOSIS — S335XXA Sprain of ligaments of lumbar spine, initial encounter: Secondary | ICD-10-CM | POA: Diagnosis not present

## 2022-04-23 DIAGNOSIS — M5442 Lumbago with sciatica, left side: Secondary | ICD-10-CM | POA: Diagnosis not present

## 2022-05-08 DIAGNOSIS — S335XXA Sprain of ligaments of lumbar spine, initial encounter: Secondary | ICD-10-CM | POA: Diagnosis not present

## 2022-05-08 DIAGNOSIS — M5442 Lumbago with sciatica, left side: Secondary | ICD-10-CM | POA: Diagnosis not present

## 2022-05-22 DIAGNOSIS — M5442 Lumbago with sciatica, left side: Secondary | ICD-10-CM | POA: Diagnosis not present

## 2022-05-22 DIAGNOSIS — S335XXA Sprain of ligaments of lumbar spine, initial encounter: Secondary | ICD-10-CM | POA: Diagnosis not present

## 2022-06-12 DIAGNOSIS — S338XXA Sprain of other parts of lumbar spine and pelvis, initial encounter: Secondary | ICD-10-CM | POA: Diagnosis not present

## 2022-06-12 DIAGNOSIS — S233XXA Sprain of ligaments of thoracic spine, initial encounter: Secondary | ICD-10-CM | POA: Diagnosis not present

## 2022-07-03 DIAGNOSIS — S338XXA Sprain of other parts of lumbar spine and pelvis, initial encounter: Secondary | ICD-10-CM | POA: Diagnosis not present

## 2022-07-03 DIAGNOSIS — S233XXA Sprain of ligaments of thoracic spine, initial encounter: Secondary | ICD-10-CM | POA: Diagnosis not present

## 2022-07-24 DIAGNOSIS — S233XXA Sprain of ligaments of thoracic spine, initial encounter: Secondary | ICD-10-CM | POA: Diagnosis not present

## 2022-07-24 DIAGNOSIS — S338XXA Sprain of other parts of lumbar spine and pelvis, initial encounter: Secondary | ICD-10-CM | POA: Diagnosis not present

## 2022-08-14 DIAGNOSIS — S233XXA Sprain of ligaments of thoracic spine, initial encounter: Secondary | ICD-10-CM | POA: Diagnosis not present

## 2022-08-14 DIAGNOSIS — S338XXA Sprain of other parts of lumbar spine and pelvis, initial encounter: Secondary | ICD-10-CM | POA: Diagnosis not present

## 2022-09-04 DIAGNOSIS — S338XXA Sprain of other parts of lumbar spine and pelvis, initial encounter: Secondary | ICD-10-CM | POA: Diagnosis not present

## 2022-09-04 DIAGNOSIS — S233XXA Sprain of ligaments of thoracic spine, initial encounter: Secondary | ICD-10-CM | POA: Diagnosis not present

## 2022-09-25 DIAGNOSIS — S338XXA Sprain of other parts of lumbar spine and pelvis, initial encounter: Secondary | ICD-10-CM | POA: Diagnosis not present

## 2022-09-25 DIAGNOSIS — S233XXA Sprain of ligaments of thoracic spine, initial encounter: Secondary | ICD-10-CM | POA: Diagnosis not present

## 2022-10-16 DIAGNOSIS — S233XXA Sprain of ligaments of thoracic spine, initial encounter: Secondary | ICD-10-CM | POA: Diagnosis not present

## 2022-10-16 DIAGNOSIS — S338XXA Sprain of other parts of lumbar spine and pelvis, initial encounter: Secondary | ICD-10-CM | POA: Diagnosis not present

## 2023-04-26 ENCOUNTER — Ambulatory Visit (INDEPENDENT_AMBULATORY_CARE_PROVIDER_SITE_OTHER): Payer: Commercial Managed Care - PPO | Admitting: Internal Medicine

## 2023-04-26 ENCOUNTER — Telehealth: Payer: Self-pay | Admitting: *Deleted

## 2023-04-26 ENCOUNTER — Other Ambulatory Visit: Payer: Self-pay | Admitting: *Deleted

## 2023-04-26 ENCOUNTER — Encounter: Payer: Self-pay | Admitting: Internal Medicine

## 2023-04-26 ENCOUNTER — Encounter: Payer: Self-pay | Admitting: *Deleted

## 2023-04-26 VITALS — BP 103/72 | HR 83 | Temp 98.5°F | Ht 75.0 in | Wt 258.2 lb

## 2023-04-26 DIAGNOSIS — R195 Other fecal abnormalities: Secondary | ICD-10-CM | POA: Diagnosis not present

## 2023-04-26 MED ORDER — PEG 3350-KCL-NA BICARB-NACL 420 G PO SOLR: 4000.0000 mL | Freq: Once | ORAL | 0 refills | Status: AC

## 2023-04-26 NOTE — Telephone Encounter (Signed)
[facility] PA: Case ID# [id number] was submitted on 04-26-2023

## 2023-04-26 NOTE — Patient Instructions (Signed)
We will schedule you for colonoscopy given your recent positive Cologuard test.  It was very nice meeting you today.  Dr. Marletta Lor

## 2023-04-26 NOTE — Progress Notes (Signed)
Primary Care Physician:  Elfredia Nevins, MD Primary Gastroenterologist:  Dr. Marletta Lor  Chief Complaint  Patient presents with   New Patient (Initial Visit)    Pos cologuard    HPI:   Danny Foster is a 52 y.o. male who presents to the clinic today by referral from his PCP Dr. Sherwood Gambler for evaluation.  Recently underwent Cologuard testing for colon cancer screening which was positive.  Denies any melena hematochezia.  No abdominal pain.  No unintentional weight loss.  Denies any family history of colorectal malignancy.  Denies any upper GI symptoms including heartburn, reflux, dysphagia/odynophagia, epigastric or chest pain.  Overall no complaints from a GI perspective.  Past Medical History:  Diagnosis Date   Gout     Past Surgical History:  Procedure Laterality Date   TONSILLECTOMY      Current Outpatient Medications  Medication Sig Dispense Refill   magnesium 30 MG tablet Take 30 mg by mouth 2 (two) times daily.     Multiple Vitamin (MULTIVITAMIN) tablet Take 1 tablet by mouth daily.     ibuprofen (ADVIL) 600 MG tablet Take 1 tablet (600 mg total) by mouth every 6 (six) hours as needed. 30 tablet 0   No current facility-administered medications for this visit.    Allergies as of 04/26/2023   (No Known Allergies)    No family history on file.  Social History   Socioeconomic History   Marital status: Married    Spouse name: Not on file   Number of children: Not on file   Years of education: Not on file   Highest education level: Not on file  Occupational History   Not on file  Tobacco Use   Smoking status: Former    Current packs/day: 0.00    Average packs/day: 1.5 packs/day for 18.0 years (27.0 ttl pk-yrs)    Types: Cigarettes    Start date: 35    Quit date: 2010    Years since quitting: 14.9   Smokeless tobacco: Never  Substance and Sexual Activity   Alcohol use: Not Currently   Drug use: Never   Sexual activity: Not on file  Other Topics  Concern   Not on file  Social History Narrative   Not on file   Social Determinants of Health   Financial Resource Strain: Not on file  Food Insecurity: Not on file  Transportation Needs: Not on file  Physical Activity: Not on file  Stress: Not on file  Social Connections: Not on file  Intimate Partner Violence: Not on file    Subjective: Review of Systems  Constitutional:  Negative for chills and fever.  HENT:  Negative for congestion and hearing loss.   Eyes:  Negative for blurred vision and double vision.  Respiratory:  Negative for cough and shortness of breath.   Cardiovascular:  Negative for chest pain and palpitations.  Gastrointestinal:  Negative for abdominal pain, blood in stool, constipation, diarrhea, heartburn, melena and vomiting.  Genitourinary:  Negative for dysuria and urgency.  Musculoskeletal:  Negative for joint pain and myalgias.  Skin:  Negative for itching and rash.  Neurological:  Negative for dizziness and headaches.  Psychiatric/Behavioral:  Negative for depression. The patient is not nervous/anxious.        Objective: BP 103/72   Pulse 83   Temp 98.5 F (36.9 C)   Ht 6\' 3"  (1.905 m)   Wt 258 lb 3.2 oz (117.1 kg)   BMI 32.27 kg/m  Physical Exam Constitutional:  Appearance: Normal appearance.  HENT:     Head: Normocephalic and atraumatic.  Eyes:     Extraocular Movements: Extraocular movements intact.     Conjunctiva/sclera: Conjunctivae normal.  Cardiovascular:     Rate and Rhythm: Normal rate and regular rhythm.  Pulmonary:     Effort: Pulmonary effort is normal.     Breath sounds: Normal breath sounds.  Abdominal:     General: Bowel sounds are normal.     Palpations: Abdomen is soft.  Musculoskeletal:        General: Normal range of motion.     Cervical back: Normal range of motion and neck supple.  Skin:    General: Skin is warm.  Neurological:     General: No focal deficit present.     Mental Status: He is alert and  oriented to person, place, and time.  Psychiatric:        Mood and Affect: Mood normal.        Behavior: Behavior normal.      Assessment: *Positive Cologuard testing  Plan: Will schedule for screening colonoscopy.The risks including infection, bleed, or perforation as well as benefits, limitations, alternatives and imponderables have been reviewed with the patient. Questions have been answered. All parties agreeable.  Thank you Dr. Sherwood Gambler for the kind referral.  04/26/2023 8:59 AM   Disclaimer: This note was dictated with voice recognition software. Similar sounding words can inadvertently be transcribed and may not be corrected upon review.

## 2023-06-04 ENCOUNTER — Encounter (HOSPITAL_COMMUNITY)
Admission: RE | Disposition: A | Discharge status: Home / Self Care | Payer: Self-pay | Source: Home / Self Care | Attending: Internal Medicine

## 2023-06-04 ENCOUNTER — Ambulatory Visit (HOSPITAL_BASED_OUTPATIENT_CLINIC_OR_DEPARTMENT_OTHER): Payer: Commercial Managed Care - PPO | Admitting: Anesthesiology

## 2023-06-04 ENCOUNTER — Ambulatory Visit (HOSPITAL_COMMUNITY)
Admission: RE | Admit: 2023-06-04 | Discharge: 2023-06-04 | Disposition: A | Discharge status: Home / Self Care | Payer: Commercial Managed Care - PPO | Attending: Internal Medicine | Admitting: Internal Medicine

## 2023-06-04 ENCOUNTER — Other Ambulatory Visit: Payer: Self-pay

## 2023-06-04 ENCOUNTER — Encounter (HOSPITAL_COMMUNITY): Payer: Self-pay

## 2023-06-04 ENCOUNTER — Ambulatory Visit (HOSPITAL_COMMUNITY): Payer: Commercial Managed Care - PPO | Admitting: Anesthesiology

## 2023-06-04 DIAGNOSIS — Z1211 Encounter for screening for malignant neoplasm of colon: Secondary | ICD-10-CM

## 2023-06-04 DIAGNOSIS — K635 Polyp of colon: Secondary | ICD-10-CM

## 2023-06-04 DIAGNOSIS — R195 Other fecal abnormalities: Secondary | ICD-10-CM | POA: Diagnosis present

## 2023-06-04 DIAGNOSIS — D122 Benign neoplasm of ascending colon: Secondary | ICD-10-CM

## 2023-06-04 DIAGNOSIS — I10 Essential (primary) hypertension: Secondary | ICD-10-CM | POA: Diagnosis not present

## 2023-06-04 DIAGNOSIS — D125 Benign neoplasm of sigmoid colon: Secondary | ICD-10-CM | POA: Insufficient documentation

## 2023-06-04 DIAGNOSIS — Z87891 Personal history of nicotine dependence: Secondary | ICD-10-CM | POA: Diagnosis not present

## 2023-06-04 DIAGNOSIS — K648 Other hemorrhoids: Secondary | ICD-10-CM

## 2023-06-04 HISTORY — PX: POLYPECTOMY: SHX149

## 2023-06-04 HISTORY — PX: COLONOSCOPY WITH PROPOFOL: SHX5780

## 2023-06-04 SURGERY — COLONOSCOPY WITH PROPOFOL
Anesthesia: General

## 2023-06-04 MED ORDER — LACTATED RINGERS IV SOLN: INTRAVENOUS | Status: DC

## 2023-06-04 MED ORDER — PROPOFOL 10 MG/ML IV BOLUS
INTRAVENOUS | Status: DC | PRN
  Administered 2023-06-04: 100 mg via INTRAVENOUS
  Administered 2023-06-04: 50 mg via INTRAVENOUS

## 2023-06-04 MED ORDER — LIDOCAINE HCL (PF) 2 % IJ SOLN
INTRAMUSCULAR | Status: DC | PRN
  Administered 2023-06-04: 50 mg via INTRADERMAL

## 2023-06-04 MED ORDER — PROPOFOL 500 MG/50ML IV EMUL
INTRAVENOUS | Status: DC | PRN
  Administered 2023-06-04: 150 ug/kg/min via INTRAVENOUS

## 2023-06-04 NOTE — Discharge Instructions (Addendum)
  Colonoscopy Discharge Instructions  Read the instructions outlined below and refer to this sheet in the next few weeks. These discharge instructions provide you with general information on caring for yourself after you leave the hospital. Your doctor may also give you specific instructions. While your treatment has been planned according to the most current medical practices available, unavoidable complications occasionally occur.   ACTIVITY You may resume your regular activity, but move at a slower pace for the next 24 hours.  Take frequent rest periods for the next 24 hours.  Walking will help get rid of the air and reduce the bloated feeling in your belly (abdomen).  No driving for 24 hours (because of the medicine (anesthesia) used during the test).   Do not sign any important legal documents or operate any machinery for 24 hours (because of the anesthesia used during the test).  NUTRITION Drink plenty of fluids.  You may resume your normal diet as instructed by your doctor.  Begin with a light meal and progress to your normal diet. Heavy or fried foods are harder to digest and may make you feel sick to your stomach (nauseated).  Avoid alcoholic beverages for 24 hours or as instructed.  MEDICATIONS You may resume your normal medications unless your doctor tells you otherwise.  WHAT YOU CAN EXPECT TODAY Some feelings of bloating in the abdomen.  Passage of more gas than usual.  Spotting of blood in your stool or on the toilet paper.  IF YOU HAD POLYPS REMOVED DURING THE COLONOSCOPY: No aspirin products for 7 days or as instructed.  No alcohol for 7 days or as instructed.  Eat a soft diet for the next 24 hours.  FINDING OUT THE RESULTS OF YOUR TEST Not all test results are available during your visit. If your test results are not back during the visit, make an appointment with your caregiver to find out the results. Do not assume everything is normal if you have not heard from your  caregiver or the medical facility. It is important for you to follow up on all of your test results.  SEEK IMMEDIATE MEDICAL ATTENTION IF: You have more than a spotting of blood in your stool.  Your belly is swollen (abdominal distention).  You are nauseated or vomiting.  You have a temperature over 101.  You have abdominal pain or discomfort that is severe or gets worse throughout the day.   Your colonoscopy revealed 2 polyp(s) which I removed successfully. Await pathology results, my office will contact you. I recommend repeating colonoscopy in 7 years for surveillance purposes.    I hope you have a great rest of your week!  Carlin POUR. Cindie, D.O. Gastroenterology and Hepatology [facility]

## 2023-06-04 NOTE — Anesthesia Procedure Notes (Signed)
 Date/Time: 06/04/2023 11:12 AM  Performed by: Eliodoro Deward FALCON, CRNAPre-anesthesia Checklist: Emergency Drugs available, Patient identified, Suction available and Patient being monitored Patient Re-evaluated:Patient Re-evaluated prior to induction Oxygen Delivery Method: Nasal cannula Induction Type: IV induction Placement Confirmation: positive ETCO2

## 2023-06-04 NOTE — Anesthesia Preprocedure Evaluation (Signed)

## 2023-06-04 NOTE — Op Note (Signed)
 [facility] Patient Name: Danny Foster Procedure Date: 06/04/2023 10:44 AM MRN: [id number] Date of Birth: 1970-07-23 Attending MD: Carlin POUR. Cindie , [place], [id number] CSN: [id number] Age: 52 Admit Type: Outpatient Procedure:                Colonoscopy Indications:              Screening for colorectal malignant neoplasm,                            Incidental - Positive Cologuard test Providers:                Carlin POUR. Cindie, DO, Crystal Page, Bascom Blush Referring MD:              Medicines:                See the Anesthesia note for documentation of the                            administered medications Complications:            No immediate complications. Estimated Blood Loss:     Estimated blood loss was minimal. Procedure:                Pre-Anesthesia Assessment:                           - The anesthesia plan was to use monitored                            anesthesia care (MAC).                           After obtaining informed consent, the colonoscope                            was passed under direct vision. Throughout the                            procedure, the patient's blood pressure, pulse, and                            oxygen saturations were monitored continuously. The                            [id number]-HQ190L ([id number]) scope was introduced through                            the anus and advanced to the the cecum, identified                            by appendiceal orifice and ileocecal valve. The                            colonoscopy was performed without difficulty. The                            patient tolerated  the procedure well. The quality                            of the bowel preparation was evaluated using the                            BBPS [facility] Bowel Preparation Scale) with scores                            of: Right Colon = 3, Transverse Colon = 3 and Left                            Colon = 3 (entire mucosa seen well with no residual                             staining, small fragments of stool or opaque                            liquid). The total BBPS score equals 9. Scope In: 11:17:45 AM Scope Out: 11:35:20 AM Scope Withdrawal Time: 0 hours 12 minutes 49 seconds  Total Procedure Duration: 0 hours 17 minutes 35 seconds  Findings:      Non-bleeding internal hemorrhoids were found during endoscopy.      A 7 mm polyp was found in the ascending colon. The polyp was sessile.       The polyp was removed with a cold snare. Resection and retrieval were       complete.      A 5 mm polyp was found in the sigmoid colon. The polyp was sessile. The       polyp was removed with a cold snare. Resection and retrieval were       complete.      The exam was otherwise without abnormality. Impression:               - Non-bleeding internal hemorrhoids.                           - One 7 mm polyp in the ascending colon, removed                            with a cold snare. Resected and retrieved.                           - One 5 mm polyp in the sigmoid colon, removed with                            a cold snare. Resected and retrieved.                           - The examination was otherwise normal. Moderate Sedation:      Per Anesthesia Care Recommendation:           - Patient has a contact number available for  emergencies. The signs and symptoms of potential                            delayed complications were discussed with the                            patient. Return to normal activities tomorrow.                            Written discharge instructions were provided to the                            patient.                           - Resume previous diet.                           - Continue present medications.                           - Await pathology results.                           - Repeat colonoscopy in 7 years for surveillance.                           - Return to GI clinic  PRN. Procedure Code(s):        --- Professional ---                           [id number], Colonoscopy, flexible; with removal of                            tumor(s), polyp(s), or other lesion(s) by snare                            technique Diagnosis Code(s):        --- Professional ---                           Z12.11, Encounter for screening for malignant                            neoplasm of colon                           D12.2, Benign neoplasm of ascending colon                           D12.5, Benign neoplasm of sigmoid colon                           K64.8, Other hemorrhoids CPT copyright 2022 American Medical Association. All rights reserved. The codes documented in this report are preliminary and upon coder review may  be revised to meet current compliance requirements. Carlin POUR. Cindie, DO Carlin  LOIS Hasty, DO 06/04/2023 11:38:59 AM This report has been signed electronically. Number of Addenda: 0

## 2023-06-04 NOTE — Anesthesia Postprocedure Evaluation (Signed)
 Anesthesia Post Note  Patient: Danny Foster  Procedure(s) Performed: COLONOSCOPY WITH PROPOFOL  POLYPECTOMY INTESTINAL  Patient location during evaluation: Phase II Anesthesia Type: General Level of consciousness: awake Pain management: pain level controlled Vital Signs Assessment: post-procedure vital signs reviewed and stable Respiratory status: spontaneous breathing and respiratory function stable Cardiovascular status: blood pressure returned to baseline and stable Postop Assessment: no headache and no apparent nausea or vomiting Anesthetic complications: no Comments: Late entry   No notable events documented.   Last Vitals:  Vitals:   06/04/23 1137 06/04/23 1141  BP: (!) 90/54 102/67  Pulse: 74 87  Resp: 16 17  Temp: (!) 36.1 C   SpO2: 97% 97%    Last Pain:  Vitals:   06/04/23 1141  TempSrc:   PainSc: 0-No pain                 Yvonna JINNY Bosworth

## 2023-06-04 NOTE — Transfer of Care (Signed)
 Immediate Anesthesia Transfer of Care Note  Patient: Danny Foster  Procedure(s) Performed: COLONOSCOPY WITH PROPOFOL  POLYPECTOMY INTESTINAL  Patient Location: Endoscopy Unit  Anesthesia Type:General  Level of Consciousness: drowsy  Airway & Oxygen Therapy: Patient Spontanous Breathing  Post-op Assessment: Report given to RN and Post -op Vital signs reviewed and stable  Post vital signs: Reviewed and stable  Last Vitals:  Vitals Value Taken Time  BP    Temp    Pulse    Resp    SpO2      Last Pain:  Vitals:   06/04/23 1113  TempSrc:   PainSc: 0-No pain      Patients Stated Pain Goal: 7 (06/04/23 1108)  Complications: No notable events documented.

## 2023-06-04 NOTE — H&P (Signed)
  Primary Care Physician:  Bertell Satterfield, MD Primary Gastroenterologist:  Dr. Cindie  Pre-Procedure History & Physical: HPI:  Danny Foster is a 52 y.o. male is here for a colonoscopy for colon cancer screening purposes, positive Cologuard testing  Past Medical History:  Diagnosis Date   Gout     Past Surgical History:  Procedure Laterality Date   TONSILLECTOMY      Prior to Admission medications   Medication Sig Start Date End Date Taking? Authorizing Provider  magnesium 30 MG tablet Take 30 mg by mouth 2 (two) times daily.    [provider]  Multiple Vitamin (MULTIVITAMIN) tablet Take 1 tablet by mouth daily.    [provider]    Allergies as of 04/26/2023   (No Known Allergies)    No family history on file.  Social History   Socioeconomic History   Marital status: Married    Spouse name: Not on file   Number of children: Not on file   Years of education: Not on file   Highest education level: Not on file  Occupational History   Not on file  Tobacco Use   Smoking status: Former    Current packs/day: 0.00    Average packs/day: 1.5 packs/day for 18.0 years (27.0 ttl pk-yrs)    Types: Cigarettes    Start date: 45    Quit date: 2010    Years since quitting: 15.0   Smokeless tobacco: Never  Substance and Sexual Activity   Alcohol use: Not Currently   Drug use: Never   Sexual activity: Not on file  Other Topics Concern   Not on file  Social History Narrative   Not on file   Social Drivers of Health   Financial Resource Strain: Not on file  Food Insecurity: Not on file  Transportation Needs: Not on file  Physical Activity: Not on file  Stress: Not on file  Social Connections: Not on file  Intimate Partner Violence: Not on file    Review of Systems: See HPI, otherwise negative ROS  Physical Exam: Vital signs in last 24 hours:     General:   Alert,  Well-developed, well-nourished, pleasant and cooperative in NAD Head:   Normocephalic and atraumatic. Eyes:  Sclera clear, no icterus.   Conjunctiva pink. Ears:  Normal auditory acuity. Nose:  No deformity, discharge,  or lesions. Msk:  Symmetrical without gross deformities. Normal posture. Extremities:  Without clubbing or edema. Neurologic:  Alert and  oriented x4;  grossly normal neurologically. Skin:  Intact without significant lesions or rashes. Psych:  Alert and cooperative. Normal mood and affect.  Impression/Plan: Danny Foster is here for a colonoscopy to be performed for colon cancer screening purposes, positive Cologuard testing  The risks of the procedure including infection, bleed, or perforation as well as benefits, limitations, alternatives and imponderables have been reviewed with the patient. Questions have been answered. All parties agreeable.

## 2023-06-06 LAB — SURGICAL PATHOLOGY

## 2023-06-14 ENCOUNTER — Encounter (HOSPITAL_COMMUNITY): Payer: Self-pay | Admitting: Internal Medicine

## 2024-01-09 ENCOUNTER — Other Ambulatory Visit (HOSPITAL_COMMUNITY): Payer: Self-pay | Admitting: Otolaryngology

## 2024-01-09 ENCOUNTER — Ambulatory Visit (HOSPITAL_COMMUNITY)
Admission: RE | Admit: 2024-01-09 | Discharge: 2024-01-09 | Disposition: A | Discharge status: Home / Self Care | Source: Ambulatory Visit | Attending: Otolaryngology | Admitting: Otolaryngology

## 2024-01-09 ENCOUNTER — Encounter: Payer: Self-pay | Admitting: Emergency Medicine

## 2024-01-09 ENCOUNTER — Ambulatory Visit: Admission: EM | Admit: 2024-01-09 | Discharge: 2024-01-09 | Disposition: A | Discharge status: Home / Self Care

## 2024-01-09 DIAGNOSIS — M5459 Other low back pain: Secondary | ICD-10-CM

## 2024-01-09 DIAGNOSIS — G8929 Other chronic pain: Secondary | ICD-10-CM

## 2024-01-09 DIAGNOSIS — M5442 Lumbago with sciatica, left side: Secondary | ICD-10-CM | POA: Diagnosis not present

## 2024-01-09 MED ORDER — PREDNISONE 20 MG PO TABS: 40.0000 mg | ORAL_TABLET | Freq: Every day | ORAL | 0 refills | Status: AC

## 2024-01-09 MED ORDER — GABAPENTIN 100 MG PO CAPS: 100.0000 mg | ORAL_CAPSULE | Freq: Three times a day (TID) | ORAL | 0 refills | Status: AC

## 2024-01-09 MED ORDER — DEXAMETHASONE SODIUM PHOSPHATE 10 MG/ML IJ SOLN
10.0000 mg | INTRAMUSCULAR | Status: AC
  Administered 2024-01-09: 10 mg via INTRAMUSCULAR

## 2024-01-09 NOTE — ED Triage Notes (Signed)
 Hx of bulging disc 2 years ago.  Started to have back pain May.  1 week ago, pain became worse.  Feels pain shooting down both hips with a tingling feeling.  Feels pain worse on left side of lower back.

## 2024-01-09 NOTE — Discharge Instructions (Signed)
 You were given an injection of Decadron  10 mg today.  Start the prednisone  on 01/10/2024. Take medication as prescribed. Try to remain as active as possible. Gentle range of motion and stretching exercises to help with back spasm and pain. Continue to apply ice or heat as needed.  Ice is recommended for pain or swelling, heat for spasm or stiffness.  Apply for 20 minutes, remove for 1 hour, then repeat. Recommend over-the-counter Tylenol arthritis strength 650 mg tablets for pain.   Go to the emergency department immediately if you develop weakness in your legs or feet, inability to walk, loss of bowel or bladder function, difficulty urinating or passing a bowel movement, or other concerns. I provided information for orthopedics for you to follow-up with.  Please call to schedule an appointment as soon as possible. Follow-up as needed.

## 2024-01-09 NOTE — ED Provider Notes (Signed)
 [facility]    CSN: [id number] Arrival date & time: 01/09/24  9182      History   Chief Complaint No chief complaint on file.   HPI Danny Foster is a 53 y.o. male.   The history is provided by the patient.   Patient presents for complaints of low back pain.  Patient states he has a history of a bulging disc that started approximately 2 days ago.  He states over the past several months, he started to have pain in his bilateral lower back.  He states over the last month, his pain has worsened, with continued worsening over the past week.  Patient has been seen on 2 occasions at the [facility], states he was provided a steroid taper which seemed to help his symptoms.  Today, he has pain that starts in the lower back and shoots down to both hips with pain extending down into the left leg.  He denies injury, trauma, loss of bowel or bladder function, or saddle anesthesia.  Patient also denies difficulty with bowel movements.  Patient states that his pain is worse on the left side.  States he has been taking NSAIDs for his symptoms.  States that he is also being followed by chiropractor.  Past Medical History:  Diagnosis Date   Gout     There are no active problems to display for this patient.   Past Surgical History:  Procedure Laterality Date   COLONOSCOPY WITH PROPOFOL  N/A 06/04/2023   Procedure: COLONOSCOPY WITH PROPOFOL ;  Surgeon: Cindie Carlin POUR, DO;  Location: [facility];  Service: Endoscopy;  Laterality: N/A;  12:15 pm, asa 2   POLYPECTOMY  06/04/2023   Procedure: POLYPECTOMY INTESTINAL;  Surgeon: Cindie Carlin POUR, DO;  Location: [facility];  Service: Endoscopy;;   TONSILLECTOMY         Home Medications    Prior to Admission medications   Medication Sig Start Date End Date Taking? Authorizing Provider  celecoxib (CELEBREX) 200 MG capsule Take 200 mg by mouth 2 (two) times daily.   Yes [provider]  gabapentin  (NEURONTIN ) 100  MG capsule Take 1 capsule (100 mg total) by mouth 3 (three) times daily. 01/09/24  Yes Leath-Warren, Etta PARAS, NP  predniSONE  (DELTASONE ) 20 MG tablet Take 2 tablets (40 mg total) by mouth daily with breakfast for 5 days. 01/09/24 01/14/24 Yes Leath-Warren, Etta PARAS, NP  ipratropium (ATROVENT) 0.06 % nasal spray Place 2 sprays into both nostrils 4 (four) times daily.    [provider]  magnesium 30 MG tablet Take 30 mg by mouth 2 (two) times daily.    [provider]  Multiple Vitamin (MULTIVITAMIN) tablet Take 1 tablet by mouth daily.    [provider]    Family History History reviewed. No pertinent family history.  Social History Social History   Tobacco Use   Smoking status: Former    Current packs/day: 0.00    Average packs/day: 1.5 packs/day for 18.0 years (27.0 ttl pk-yrs)    Types: Cigarettes    Start date: 36    Quit date: 2010    Years since quitting: 15.6   Smokeless tobacco: Never  Substance Use Topics   Alcohol use: Not Currently   Drug use: Never     Allergies   Patient has no known allergies.   Review of Systems Review of Systems Per HPI  Physical Exam Triage Vital Signs ED Triage Vitals [01/09/24 0827]  Encounter Vitals Group  BP 135/80     Girls Systolic BP Percentile      Girls Diastolic BP Percentile      Boys Systolic BP Percentile      Boys Diastolic BP Percentile      Pulse Rate 86     Resp 18     Temp (!) 97.5 F (36.4 C)     Temp Source Oral     SpO2 97 %     Weight      Height      Head Circumference      Peak Flow      Pain Score 7     Pain Loc      Pain Education      Exclude from Growth Chart    No data found.  Updated Vital Signs BP 135/80 (BP Location: Right Arm)   Pulse 86   Temp (!) 97.5 F (36.4 C) (Oral)   Resp 18   SpO2 97%   Visual Acuity Right Eye Distance:   Left Eye Distance:   Bilateral Distance:    Right Eye Near:   Left Eye Near:    Bilateral Near:     Physical  Exam Vitals and nursing note reviewed.  Constitutional:      General: He is not in acute distress.    Appearance: Normal appearance.  HENT:     Head: Normocephalic.  Eyes:     Extraocular Movements: Extraocular movements intact.     Pupils: Pupils are equal, round, and reactive to light.  Cardiovascular:     Rate and Rhythm: Normal rate and regular rhythm.     Pulses: Normal pulses.     Heart sounds: Normal heart sounds.  Pulmonary:     Effort: Pulmonary effort is normal.     Breath sounds: Normal breath sounds.  Abdominal:     General: Bowel sounds are normal.     Palpations: Abdomen is soft.     Tenderness: There is no abdominal tenderness.  Musculoskeletal:     Cervical back: Normal range of motion.     Lumbar back: Tenderness present. No swelling, deformity, signs of trauma or spasms. Decreased range of motion. Positive left straight leg raise test.       Back:  Skin:    General: Skin is warm and dry.  Neurological:     General: No focal deficit present.     Mental Status: He is alert and oriented to person, place, and time.  Psychiatric:        Mood and Affect: Mood normal.        Behavior: Behavior normal.      UC Treatments / Results  Labs (all labs ordered are listed, but only abnormal results are displayed) Labs Reviewed - No data to display  EKG   Radiology No results found.  Procedures Procedures (including critical care time)  Medications Ordered in UC Medications  dexamethasone  (DECADRON ) injection 10 mg (10 mg Intramuscular Given 01/09/24 0900)    Initial Impression / Assessment and Plan / UC Course  I have reviewed the triage vital signs and the nursing notes.  Pertinent labs & imaging results that were available during my care of the patient were reviewed by me and considered in my medical decision making (see chart for details).  Patient with long history of low back pain.  History of bulging disc per the patient's report.  Pain has  worsened over the past week.  He has no red flag symptoms noted  on exam to include cauda equina.  Decadron  10 mg IM administered today.  Will start another course of prednisone  40 mg for the next 5 days.  Will also treat with gabapentin  100 mg.  Supportive care recommendations were provided discussed with the patient to include continuing use of over-the-counter Tylenol, the use of ice or heat, and stretching exercises.  At this point, patient was advised it is recommended that he follow-up with orthopedics for further evaluation and treatment.  Patient was given strict ER follow-up precautions.  Patient was in agreement with this plan of care and verbalizes understanding.  All questions were answered.  Patient stable for discharge.   Final Clinical Impressions(s) / UC Diagnoses   Final diagnoses:  Chronic bilateral low back pain with left-sided sciatica     Discharge Instructions      You were given an injection of Decadron  10 mg today.  Start the prednisone  on 01/10/2024. Take medication as prescribed. Try to remain as active as possible. Gentle range of motion and stretching exercises to help with back spasm and pain. Continue to apply ice or heat as needed.  Ice is recommended for pain or swelling, heat for spasm or stiffness.  Apply for 20 minutes, remove for 1 hour, then repeat. Recommend over-the-counter Tylenol arthritis strength 650 mg tablets for pain.   Go to the emergency department immediately if you develop weakness in your legs or feet, inability to walk, loss of bowel or bladder function, difficulty urinating or passing a bowel movement, or other concerns. I provided information for orthopedics for you to follow-up with.  Please call to schedule an appointment as soon as possible. Follow-up as needed.     ED Prescriptions     Medication Sig Dispense Auth. Provider   predniSONE  (DELTASONE ) 20 MG tablet Take 2 tablets (40 mg total) by mouth daily with breakfast for 5 days. 10  tablet Leath-Warren, Etta PARAS, NP   gabapentin  (NEURONTIN ) 100 MG capsule Take 1 capsule (100 mg total) by mouth 3 (three) times daily. 30 capsule Leath-Warren, Etta PARAS, NP      PDMP not reviewed this encounter.   Gilmer Etta PARAS, NP 01/09/24 [phone]

## 2024-05-10 ENCOUNTER — Other Ambulatory Visit: Payer: Self-pay

## 2024-05-10 ENCOUNTER — Encounter (HOSPITAL_COMMUNITY): Payer: Self-pay

## 2024-05-10 ENCOUNTER — Emergency Department (HOSPITAL_COMMUNITY)
Admission: EM | Admit: 2024-05-10 | Discharge: 2024-05-10 | Disposition: A | Discharge status: Home / Self Care | Attending: Emergency Medicine | Admitting: Emergency Medicine

## 2024-05-10 DIAGNOSIS — I4891 Unspecified atrial fibrillation: Secondary | ICD-10-CM

## 2024-05-10 LAB — CBC
HCT: 52.4 % — ABNORMAL HIGH (ref 39.0–52.0)
Hemoglobin: 17.4 g/dL — ABNORMAL HIGH (ref 13.0–17.0)
MCH: 27.9 pg (ref 26.0–34.0)
MCHC: 33.2 g/dL (ref 30.0–36.0)
MCV: 84.1 fL (ref 80.0–100.0)
Platelets: 246 K/uL (ref 150–400)
RBC: 6.23 MIL/uL — ABNORMAL HIGH (ref 4.22–5.81)
RDW: 13.2 % (ref 11.5–15.5)
WBC: 9.9 K/uL (ref 4.0–10.5)
nRBC: 0 % (ref 0.0–0.2)

## 2024-05-10 LAB — COMPREHENSIVE METABOLIC PANEL WITH GFR
ALT: 23 U/L (ref 0–44)
AST: 23 U/L (ref 15–41)
Albumin: 4 g/dL (ref 3.5–5.0)
Alkaline Phosphatase: 57 U/L (ref 38–126)
Anion gap: 9 (ref 5–15)
BUN: 17 mg/dL (ref 6–20)
CO2: 26 mmol/L (ref 22–32)
Calcium: 8.9 mg/dL (ref 8.9–10.3)
Chloride: 104 mmol/L (ref 98–111)
Creatinine, Ser: 1.41 mg/dL — ABNORMAL HIGH (ref 0.61–1.24)
GFR, Estimated: >60 mL/min (ref >60)
Glucose, Bld: 82 mg/dL (ref 70–99)
Potassium: 4.7 mmol/L (ref 3.5–5.1)
Sodium: 139 mmol/L (ref 135–145)
Total Bilirubin: 0.9 mg/dL (ref 0.0–1.2)
Total Protein: 6.6 g/dL (ref 6.5–8.1)

## 2024-05-10 LAB — TSH: TSH: 0.904 u[IU]/mL (ref 0.350–4.500)

## 2024-05-10 LAB — CBG MONITORING, ED: Glucose-Capillary: 111 mg/dL — ABNORMAL HIGH (ref 70–99)

## 2024-05-10 LAB — MAGNESIUM: Magnesium: 2.4 mg/dL (ref 1.7–2.4)

## 2024-05-10 MED ORDER — PROPOFOL 10 MG/ML IV BOLUS
0.5000 mg/kg | Freq: Once | INTRAVENOUS | Status: DC
  Filled 2024-05-10: qty 20

## 2024-05-10 MED ORDER — APIXABAN 5 MG PO TABS: 5.0000 mg | ORAL_TABLET | Freq: Two times a day (BID) | ORAL | 0 refills | Status: DC

## 2024-05-10 MED ORDER — SODIUM CHLORIDE 0.9 % IV BOLUS
1000.0000 mL | Freq: Once | INTRAVENOUS | Status: AC
  Administered 2024-05-10: 1000 mL via INTRAVENOUS

## 2024-05-10 MED ORDER — METOPROLOL SUCCINATE ER 25 MG PO TB24: 25.0000 mg | ORAL_TABLET | Freq: Every day | ORAL | 0 refills | Status: AC

## 2024-05-10 MED ORDER — PROPOFOL 10 MG/ML IV BOLUS
INTRAVENOUS | Status: AC | PRN
  Administered 2024-05-10: 31.7 ug via INTRAVENOUS
  Administered 2024-05-10: 25 ug via INTRAVENOUS

## 2024-05-10 MED ORDER — APIXABAN 5 MG PO TABS
5.0000 mg | ORAL_TABLET | Freq: Once | ORAL | Status: AC
  Administered 2024-05-10: 5 mg via ORAL
  Filled 2024-05-10: qty 1

## 2024-05-10 NOTE — ED Notes (Signed)
 Extra blue, lavender and dark green sent to lab

## 2024-05-10 NOTE — Discharge Instructions (Addendum)

## 2024-05-10 NOTE — ED Notes (Signed)
 Pt was discharged home with wife and adult children. Wheelchair offered but pt declined. Able to ambulate without difficulty.

## 2024-05-10 NOTE — ED Triage Notes (Signed)
 Pt states he is having a heart problem feels like he is having palpitations and had an episode of LOC. Afterwards pt was diaphoretic, pale, and spaced out per family.  Denies hitting head or being on blood thinners. No cardiac hx but states it runs in the family.  Per son who is fireman: Sitting HR 76 BP 138/96 Standing BP 128/84

## 2024-05-10 NOTE — ED Provider Notes (Signed)
 [place] EMERGENCY DEPARTMENT AT [facility] Provider Note   CSN: [id number] Arrival date & time: 05/10/24  1142     Patient presents with: Loss of Consciousness and Palpitations   Danny Foster is a 53 y.o. male.   Pt is a 53 yo male with no significant pmhx.  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  He felt fine and went to bed.  He woke up in the middle of the night and felt some palpitations and did not feel quite right.  He went back to sleep and woke up this morning and could feel palpitations.  He had a syncopal event briefly that was witnessed by family.  He has no hx of afib.  No blood thinners.       Prior to Admission medications   Medication Sig Start Date End Date Taking? Authorizing Provider  apixaban  (ELIQUIS ) 5 MG TABS tablet Take 1 tablet (5 mg total) by mouth 2 (two) times daily. 05/10/24 06/09/24 Yes Dean Clarity, MD  methocarbamol (ROBAXIN) 500 MG tablet Take 1 tablet (500 mg total) by mouth 4 (four) times a day 11/29/23  Yes [provider]  metoprolol  succinate (TOPROL -XL) 25 MG 24 hr tablet Take 1 tablet (25 mg total) by mouth daily. Take only if your HR goes above 150 and your SBP stays above 100. 05/10/24  Yes Dean Clarity, MD  celecoxib (CELEBREX) 200 MG capsule Take 200 mg by mouth 2 (two) times daily.    [provider]  gabapentin  (NEURONTIN ) 100 MG capsule Take 1 capsule (100 mg total) by mouth 3 (three) times daily. 01/09/24   Leath-Warren, Etta PARAS, NP  ipratropium (ATROVENT) 0.06 % nasal spray Place 2 sprays into both nostrils 4 (four) times daily.    [provider]  magnesium 30 MG tablet Take 30 mg by mouth 2 (two) times daily.    [provider]  Multiple Vitamin (MULTIVITAMIN) tablet Take 1 tablet by mouth daily.    [provider]    Allergies: Patient has no known allergies.    Review of Systems  Cardiovascular:  Positive for palpitations.  Neurological:  Positive  for syncope.  All other systems reviewed and are negative.   Updated Vital Signs BP 101/63   Pulse 66   Temp 98.1 F (36.7 C) (Oral)   Resp 15   Ht 6' 3 (1.905 m)   Wt 113.4 kg   SpO2 100%   BMI 31.25 kg/m   Physical Exam Vitals and nursing note reviewed.  Constitutional:      Appearance: Normal appearance.  HENT:     Head: Normocephalic and atraumatic.     Right Ear: External ear normal.     Left Ear: External ear normal.     Nose: Nose normal.     Mouth/Throat:     Mouth: Mucous membranes are moist.     Pharynx: Oropharynx is clear.  Eyes:     Extraocular Movements: Extraocular movements intact.     Conjunctiva/sclera: Conjunctivae normal.     Pupils: Pupils are equal, round, and reactive to light.  Cardiovascular:     Rate and Rhythm: Tachycardia present. Rhythm irregular.     Pulses: Normal pulses.     Heart sounds: Normal heart sounds.  Pulmonary:     Effort: Pulmonary effort is normal.     Breath sounds: Normal breath sounds.  Abdominal:     General: Abdomen is flat. Bowel sounds are normal.  Palpations: Abdomen is soft.  Musculoskeletal:        General: Normal range of motion.     Cervical back: Normal range of motion and neck supple.  Skin:    General: Skin is warm.     Capillary Refill: Capillary refill takes less than 2 seconds.  Neurological:     General: No focal deficit present.     Mental Status: He is alert and oriented to person, place, and time.  Psychiatric:        Mood and Affect: Mood normal.        Behavior: Behavior normal.     (all labs ordered are listed, but only abnormal results are displayed) Labs Reviewed  COMPREHENSIVE METABOLIC PANEL WITH GFR - Abnormal; Notable for the following components:      Result Value   Creatinine, Ser 1.41 (*)    All other components within normal limits  CBC - Abnormal; Notable for the following components:   RBC 6.23 (*)    Hemoglobin 17.4 (*)    HCT 52.4 (*)    All other components within  normal limits  CBG MONITORING, ED - Abnormal; Notable for the following components:   Glucose-Capillary 111 (*)    All other components within normal limits  MAGNESIUM  TSH    EKG: EKG Interpretation Date/Time:  Sunday May 10 2024 13:26:06 EST Ventricular Rate:  80 PR Interval:  146 QRS Duration:  87 QT Interval:  319 QTC Calculation: 368 R Axis:   56  Text Interpretation: Sinus rhythm Left atrial enlargement now in sinus after cardioversion Confirmed by Dean Clarity [phone]) on 05/10/2024 1:31:37 PM  Radiology: No results found.   .Sedation  Date/Time: 05/10/2024 1:33 PM  Performed by: Dean Clarity, MD Authorized by: Dean Clarity, MD   Consent:    Consent obtained:  Written   Consent given by:  Patient   Risks discussed:  Allergic reaction, dysrhythmia, inadequate sedation, nausea, prolonged hypoxia resulting in organ damage, prolonged sedation necessitating reversal, respiratory compromise necessitating ventilatory assistance and intubation and vomiting   Alternatives discussed:  Analgesia without sedation, anxiolysis and regional anesthesia Universal protocol:    Procedure explained and questions answered to patient or proxy's satisfaction: yes     Relevant documents present and verified: yes     Test results available: yes     Imaging studies available: yes     Required blood products, implants, devices, and special equipment available: yes     Site/side marked: yes     Immediately prior to procedure, a time out was called: yes     Patient identity confirmed:  Verbally with patient Indications:    Procedure performed:  Cardioversion   Procedure necessitating sedation performed by:  Physician performing sedation Pre-sedation assessment:    Time since last food or drink:  4   ASA classification: class 1 - normal, healthy patient     Mouth opening:  3 or more finger widths   Thyromental distance:  4 finger widths   Mallampati score:  I - soft palate,  uvula, fauces, pillars visible   Neck mobility: normal     Pre-sedation assessments completed and reviewed: airway patency, cardiovascular function, hydration status, mental status, nausea/vomiting, pain level, respiratory function and temperature   A pre-sedation assessment was completed prior to the start of the procedure Immediate pre-procedure details:    Reassessment: Patient reassessed immediately prior to procedure     Reviewed: vital signs, relevant labs/tests and NPO status  Verified: bag valve mask available, emergency equipment available, intubation equipment available, IV patency confirmed, oxygen available and suction available   Procedure details (see MAR for exact dosages):    Preoxygenation:  Nasal cannula   Sedation:  Propofol    Intended level of sedation: deep   Intra-procedure monitoring:  Blood pressure monitoring, cardiac monitor, continuous pulse oximetry, frequent LOC assessments, frequent vital sign checks and continuous capnometry   Intra-procedure events: none     Total Provider sedation time (minutes):  30 Post-procedure details:   A post-sedation assessment was completed following the completion of the procedure.   Attendance: Constant attendance by certified staff until patient recovered     Recovery: Patient returned to pre-procedure baseline     Post-sedation assessments completed and reviewed: airway patency, cardiovascular function, hydration status, mental status, nausea/vomiting, pain level, respiratory function and temperature     Patient is stable for discharge or admission: yes     Procedure completion:  Tolerated well, no immediate complications .Cardioversion  Date/Time: 05/10/2024 1:33 PM  Performed by: Dean Clarity, MD Authorized by: Dean Clarity, MD   Consent:    Consent obtained:  Written   Alternatives discussed:  No treatment Pre-procedure details:    Cardioversion basis:  Emergent   Rhythm:  Atrial fibrillation   Electrode  placement:  Anterior-posterior Attempt one:    Cardioversion mode:  Synchronous   Shock (Joules):  200   Shock outcome:  Conversion to normal sinus rhythm Post-procedure details:    Patient status:  Awake   Patient tolerance of procedure:  Tolerated well, no immediate complications    Medications Ordered in the ED  propofol  (DIPRIVAN ) 10 mg/mL bolus/IV push 56.7 mg (56.7 mg Intravenous Not Given 05/10/24 1416)  propofol  (DIPRIVAN ) 10 mg/mL bolus/IV push (31.7 mcg Intravenous Given 05/10/24 1323)  sodium chloride  0.9 % bolus 1,000 mL (1,000 mLs Intravenous New Bag/Given 05/10/24 1317)  apixaban  (ELIQUIS ) tablet 5 mg (5 mg Oral Given 05/10/24 1431)                                    Medical Decision Making Amount and/or Complexity of Data Reviewed Labs: ordered.  Risk Prescription drug management.   This patient presents to the ED for concern of palpitations, this involves an extensive number of treatment options, and is a complaint that carries with it a high risk of complications and morbidity.  The differential diagnosis includes afib, svt, vt, st   Co morbidities that complicate the patient evaluation  none   Additional history obtained:  Additional history obtained from epic chart review External records from outside source obtained and reviewed including family   Lab Tests:  I Ordered, and personally interpreted labs.  The pertinent results include:  cbc with hgb elevated at 17.4; cmp nl other than cr elevated at 1.41, mg nl; tsh nl   Cardiac Monitoring:  The patient was maintained on a cardiac monitor.  I personally viewed and interpreted the cardiac monitored which showed an underlying rhythm of: afib with rvr initially; now NSR   Medicines ordered and prescription drug management:  I ordered medication including ivfs  for hypotension  Reevaluation of the patient after these medicines showed that the patient improved I have reviewed the patients home  medicines and have made adjustments as needed   Test Considered:  cardioversion   Critical Interventions:  cardioversion   Consultations Obtained:  I requested consultation with the  cardiologist (Dr. Delford),  and discussed lab and imaging findings as well as pertinent plan - he recommends cardioversion, starting Eliquis , and prn Toprol .  He will send a note to the afib clinic.   Problem List / ED Course:  Afib with RVR:  pt converted to NSR.  He was observed and feels much better.  He is able to ambulate without problems.  He is stable for d/c.  Return if worse.    Reevaluation:  After the interventions noted above, I reevaluated the patient and found that they have :improved   Social Determinants of Health:  Lives at home   Dispostion:  After consideration of the diagnostic results and the patients response to treatment, I feel that the patent would benefit from discharge with outpatient f/u.  CRITICAL CARE Performed by: Mliss Boyers   Total critical care time: 30 minutes  Critical care time was exclusive of separately billable procedures and treating other patients.  Critical care was necessary to treat or prevent imminent or life-threatening deterioration.  Critical care was time spent personally by me on the following activities: development of treatment plan with patient and/or surrogate as well as nursing, discussions with consultants, evaluation of patient's response to treatment, examination of patient, obtaining history from patient or surrogate, ordering and performing treatments and interventions, ordering and review of laboratory studies, ordering and review of radiographic studies, pulse oximetry and re-evaluation of patient's condition.        Final diagnoses:  Atrial fibrillation with RVR [facility])    ED Discharge Orders          Ordered    apixaban  (ELIQUIS ) 5 MG TABS tablet  2 times daily        05/10/24 1409    metoprolol  succinate  (TOPROL -XL) 25 MG 24 hr tablet  Daily        05/10/24 1412               Jamonta Goerner, MD 05/10/24 1502

## 2024-05-10 NOTE — ED Notes (Signed)
 Pt ambulated from his room to nurses stations without difficulty, states he feels fine, no lightheadedness or dizziness. Reports his heart does not feel like it is racing at this time.

## 2024-05-11 ENCOUNTER — Telehealth (HOSPITAL_COMMUNITY): Payer: Self-pay

## 2024-05-11 NOTE — Telephone Encounter (Signed)
 Contacted patient for ER follow up appointment. Scheduled patient on 12/18 @ 1:30 pm to see Thom Shy. Patient is aware to arrive 15 minutes early. Patient given directions to access our clinic and he verbalized understanding.

## 2024-05-21 ENCOUNTER — Ambulatory Visit (HOSPITAL_COMMUNITY): Admission: RE | Admit: 2024-05-21 | Source: Ambulatory Visit

## 2024-05-21 ENCOUNTER — Encounter (HOSPITAL_COMMUNITY): Payer: Self-pay | Admitting: Internal Medicine

## 2024-05-21 ENCOUNTER — Ambulatory Visit (HOSPITAL_COMMUNITY): Admission: RE | Admit: 2024-05-21 | Discharge: 2024-05-21 | Attending: Internal Medicine | Admitting: Internal Medicine

## 2024-05-21 VITALS — BP 130/82 | HR 77 | Ht 75.0 in | Wt 261.4 lb

## 2024-05-21 DIAGNOSIS — I4891 Unspecified atrial fibrillation: Secondary | ICD-10-CM

## 2024-05-21 MED ORDER — APIXABAN 5 MG PO TABS: 5.0000 mg | ORAL_TABLET | Freq: Two times a day (BID) | ORAL | 1 refills | Status: DC

## 2024-05-21 NOTE — Progress Notes (Signed)
 Primary Care Physician: Shona Norleen PEDLAR, MD Primary Cardiologist: None Electrophysiologist: None  Referring Physician: ED   Danny Foster is a 53 y.o. male with a history of paroxysmal AF, gout, who presents for consultation in the [facility].  The patient is a company secretary and was initially diagnosed with atrial fibrillation on 05/10/2024 after experiencing a witnessed syncopal event with frequent palpitations.  EKG in the ED showed AF with RVR and patient underwent urgent DCCV with conversion to sinus rhythm and was started on Eliquis  5 mg twice daily and discharged with Toprol -XL 25 mg daily.  Mr. Granquist presents today for follow-up of recent new onset atrial fibrillation.  He reports since his ED visit that he has not experienced any further symptomatic episodes of tachycardia.  EKG today was completed showing sinus rhythm with controlled heart rate and blood pressure was stable.  He has not missed any doses of his Eliquis  and has not required his as needed Toprol -XL with current controlled heart rate.  He has a history of snoring and has never been evaluated for sleep apnea.  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  We will complete further evaluation to rule out possible structural heart disease with a 2D echo and also have him wear a live ZIO monitor due to his episode of syncope that occurred during his episode onset.  We also discussed long-term monitoring with an Apple Watch or Kardia mobile in the future.  Today, he denies symptoms of palpitations, chest pain, shortness of breath, orthopnea, PND, lower extremity edema, dizziness, presyncope, syncope, snoring, daytime somnolence, bleeding, or neurologic sequela. The patient is tolerating medications without difficulties and is otherwise without complaint today.   Atrial Fibrillation Risk Factors:  Will be referred for sleep study Patient does have a history of early familial  atrial fibrillation or other arrhythmias   Atrial Fibrillation Management history:  Previous antiarrhythmic drugs: None Previous cardioversions: 05/10/2024 Previous ablations: None Anticoagulation history: Eliquis  5 mg twice daily  ROS- All systems are reviewed and negative except as per the HPI above.  Past Medical History:  Diagnosis Date   Gout     Current Outpatient Medications  Medication Sig Dispense Refill   apixaban  (ELIQUIS ) 5 MG TABS tablet Take 1 tablet (5 mg total) by mouth 2 (two) times daily. 60 tablet 0   celecoxib (CELEBREX) 200 MG capsule Take 200 mg by mouth 2 (two) times daily.     gabapentin  (NEURONTIN ) 100 MG capsule Take 1 capsule (100 mg total) by mouth 3 (three) times daily. 30 capsule 0   ipratropium (ATROVENT) 0.06 % nasal spray Place 2 sprays into both nostrils 4 (four) times daily.     magnesium 30 MG tablet Take 30 mg by mouth 2 (two) times daily.     methocarbamol (ROBAXIN) 500 MG tablet Take 1 tablet (500 mg total) by mouth 4 (four) times a day     metoprolol  succinate (TOPROL -XL) 25 MG 24 hr tablet Take 1 tablet (25 mg total) by mouth daily. Take only if your HR goes above 150 and your SBP stays above 100. 30 tablet 0   Multiple Vitamin (MULTIVITAMIN) tablet Take 1 tablet by mouth daily.     No current facility-administered medications for this encounter.    Physical Exam: BP 130/82   Pulse 77   Ht 6' 3 (1.905 m)   Wt 118.6 kg   BMI 32.67 kg/m   GEN:  Well nourished, well developed in no acute distress NECK: No JVD; No carotid bruits CARDIAC: Regular rate and rhythm, no murmurs, rubs, gallops RESPIRATORY:  Clear to auscultation without rales, wheezing or rhonchi  ABDOMEN: Soft, non-tender, non-distended EXTREMITIES:  No edema; No deformity   Wt Readings from Last 3 Encounters:  05/21/24 118.6 kg  05/10/24 113.4 kg  06/04/23 113.4 kg     EKG today demonstrates:   EKG Interpretation Date/Time:  Thursday May 21 2024 13:40:35  EST Ventricular Rate:  77 PR Interval:  130 QRS Duration:  88 QT Interval:  378 QTC Calculation: 427 R Axis:   57  Text Interpretation: Normal sinus rhythm Normal ECG When compared with ECG of 10-May-2024 13:26, PREVIOUS ECG IS PRESENT Confirmed by Wyn Manus [phone]) on 05/21/2024 2:18:04 PM       Echo to be ordered   ASSESSMENT AND PLAN: Paroxysmal Atrial Fibrillation (ICD10:  I48.0) The patient's CHA2DS2-VASc score is 0, indicating a 0.2% annual risk of stroke.   -  diagnosed with AF on 05/10/2024 with DCCV and initiation of anticoagulant and rate control -Today patient is sinus rhythm with controlled heart rate and reports no recurrence of tachycardia since his ED visit. - Continue Eliquis  5 mg twice daily and plan to discontinue if no recurrence after 4 weeks -Continue as needed Toprol -XL 25 mg for heart rate above 150 and systolic blood pressure above 899 -We will order a 2D echo to rule out structural/valvular heart disease. - 2-week live ZIO monitor   Hypercoagulable state: -Patient denies any bleeding or bruising on Eliquis  - Continue Eliquis  5 mg twice daily with plan to discontinue if no reoccurrence in 4 weeks.  History of sleep disturbance: - Ambulatory referral to [facility] sleep medicine   Follow up with the AF Clinic in 6 weeks  Manus Wyn, NP-C [facility] [address] [place], [place] [address] [phone]

## 2024-05-21 NOTE — Patient Instructions (Signed)
 Wear monitor for 14 days remove and send off in mail 06/04/24

## 2024-05-29 ENCOUNTER — Other Ambulatory Visit (HOSPITAL_COMMUNITY): Payer: Self-pay

## 2024-05-29 DIAGNOSIS — I4891 Unspecified atrial fibrillation: Secondary | ICD-10-CM

## 2024-06-01 ENCOUNTER — Encounter (HOSPITAL_COMMUNITY): Payer: Self-pay

## 2024-06-02 ENCOUNTER — Ambulatory Visit (HOSPITAL_COMMUNITY)
Admission: RE | Admit: 2024-06-02 | Discharge: 2024-06-02 | Disposition: A | Discharge status: Home / Self Care | Source: Ambulatory Visit | Attending: Internal Medicine | Admitting: Internal Medicine

## 2024-06-02 DIAGNOSIS — I4891 Unspecified atrial fibrillation: Secondary | ICD-10-CM | POA: Diagnosis not present

## 2024-06-02 LAB — ECHOCARDIOGRAM COMPLETE
AR max vel: 3.13 cm2
AV Area VTI: 3.54 cm2
AV Area mean vel: 3.31 cm2
AV Mean grad: 2 mmHg
AV Peak grad: 4.4 mmHg
Ao pk vel: 1.05 m/s
Area-P 1/2: 3.58 cm2
S' Lateral: 2.9 cm

## 2024-06-03 ENCOUNTER — Ambulatory Visit (HOSPITAL_COMMUNITY): Payer: Self-pay | Admitting: Nurse Practitioner

## 2024-06-12 NOTE — Addendum Note (Signed)
 Encounter addended by: Franchot Glade RAMAN, RN on: 06/12/2024 3:35 PM  Actions taken: Imaging Exam ended

## 2024-06-18 ENCOUNTER — Other Ambulatory Visit (HOSPITAL_COMMUNITY): Payer: Self-pay | Admitting: *Deleted

## 2024-06-18 ENCOUNTER — Ambulatory Visit (HOSPITAL_COMMUNITY): Payer: Self-pay | Admitting: Nurse Practitioner

## 2024-07-13 ENCOUNTER — Ambulatory Visit (HOSPITAL_COMMUNITY): Admitting: Nurse Practitioner
